# Patient Record
Sex: Female | Born: 1960
Health system: Southern US, Community
[De-identification: ages and names within clinical notes are randomized; demographics above are authoritative.]

## PROBLEM LIST (undated history)

## (undated) DIAGNOSIS — B379 Candidiasis, unspecified: Secondary | ICD-10-CM

## (undated) DIAGNOSIS — M8580 Other specified disorders of bone density and structure, unspecified site: Secondary | ICD-10-CM

## (undated) DIAGNOSIS — Z789 Other specified health status: Secondary | ICD-10-CM

## (undated) DIAGNOSIS — C801 Malignant (primary) neoplasm, unspecified: Secondary | ICD-10-CM

## (undated) DIAGNOSIS — D239 Other benign neoplasm of skin, unspecified: Secondary | ICD-10-CM

## (undated) HISTORY — DX: Other benign neoplasm of skin, unspecified: D23.9

## (undated) HISTORY — PX: WISDOM TOOTH EXTRACTION: SHX21

## (undated) HISTORY — DX: Other specified disorders of bone density and structure, unspecified site: M85.80

## (undated) HISTORY — DX: Malignant (primary) neoplasm, unspecified: C80.1

## (undated) HISTORY — DX: Candidiasis, unspecified: B37.9

## (undated) HISTORY — PX: GYNECOLOGIC CRYOSURGERY: SHX857

## (undated) HISTORY — PX: MOLE REMOVAL: SHX2046

## (undated) HISTORY — PX: BREAST LUMPECTOMY: SHX2

---

## 1997-09-18 ENCOUNTER — Other Ambulatory Visit: Admission: RE | Admit: 1997-09-18 | Discharge: 1997-09-18 | Payer: Self-pay | Admitting: *Deleted

## 1999-09-17 ENCOUNTER — Other Ambulatory Visit: Admission: RE | Admit: 1999-09-17 | Discharge: 1999-09-17 | Payer: Self-pay | Admitting: *Deleted

## 2001-08-30 ENCOUNTER — Other Ambulatory Visit: Admission: RE | Admit: 2001-08-30 | Discharge: 2001-08-30 | Payer: Self-pay | Admitting: *Deleted

## 2002-09-15 ENCOUNTER — Other Ambulatory Visit: Admission: RE | Admit: 2002-09-15 | Discharge: 2002-09-15 | Payer: Self-pay | Admitting: *Deleted

## 2003-04-18 ENCOUNTER — Other Ambulatory Visit: Admission: RE | Admit: 2003-04-18 | Discharge: 2003-04-18 | Payer: Self-pay | Admitting: Radiology

## 2003-05-02 ENCOUNTER — Encounter (HOSPITAL_COMMUNITY): Admission: RE | Admit: 2003-05-02 | Discharge: 2003-07-31 | Payer: Self-pay | Admitting: Surgery

## 2003-05-14 ENCOUNTER — Encounter: Admission: RE | Admit: 2003-05-14 | Discharge: 2003-05-14 | Payer: Self-pay | Admitting: Surgery

## 2003-05-17 ENCOUNTER — Encounter (INDEPENDENT_AMBULATORY_CARE_PROVIDER_SITE_OTHER): Payer: Self-pay | Admitting: Specialist

## 2003-05-17 ENCOUNTER — Ambulatory Visit (HOSPITAL_COMMUNITY): Admission: RE | Admit: 2003-05-17 | Discharge: 2003-05-17 | Payer: Self-pay | Admitting: Surgery

## 2003-05-17 ENCOUNTER — Encounter (INDEPENDENT_AMBULATORY_CARE_PROVIDER_SITE_OTHER): Payer: Self-pay | Admitting: Surgery

## 2003-05-17 ENCOUNTER — Ambulatory Visit (HOSPITAL_BASED_OUTPATIENT_CLINIC_OR_DEPARTMENT_OTHER): Admission: RE | Admit: 2003-05-17 | Discharge: 2003-05-17 | Payer: Self-pay | Admitting: Surgery

## 2003-06-19 ENCOUNTER — Ambulatory Visit: Admission: RE | Admit: 2003-06-19 | Discharge: 2003-08-15 | Payer: Self-pay | Admitting: Radiation Oncology

## 2003-06-20 ENCOUNTER — Ambulatory Visit (HOSPITAL_COMMUNITY): Admission: RE | Admit: 2003-06-20 | Discharge: 2003-06-20 | Payer: Self-pay | Admitting: Oncology

## 2003-06-22 ENCOUNTER — Ambulatory Visit (HOSPITAL_COMMUNITY): Admission: RE | Admit: 2003-06-22 | Discharge: 2003-06-22 | Payer: Self-pay | Admitting: Oncology

## 2003-06-29 ENCOUNTER — Ambulatory Visit (HOSPITAL_COMMUNITY): Admission: RE | Admit: 2003-06-29 | Discharge: 2003-06-29 | Payer: Self-pay | Admitting: Surgery

## 2003-06-29 ENCOUNTER — Ambulatory Visit (HOSPITAL_BASED_OUTPATIENT_CLINIC_OR_DEPARTMENT_OTHER): Admission: RE | Admit: 2003-06-29 | Discharge: 2003-06-29 | Payer: Self-pay | Admitting: Surgery

## 2003-07-04 ENCOUNTER — Ambulatory Visit (HOSPITAL_COMMUNITY): Admission: RE | Admit: 2003-07-04 | Discharge: 2003-07-04 | Payer: Self-pay | Admitting: Oncology

## 2003-07-19 ENCOUNTER — Inpatient Hospital Stay (HOSPITAL_COMMUNITY): Admission: EM | Admit: 2003-07-19 | Discharge: 2003-07-20 | Payer: Self-pay | Admitting: Emergency Medicine

## 2003-10-30 ENCOUNTER — Ambulatory Visit: Admission: RE | Admit: 2003-10-30 | Discharge: 2003-10-30 | Payer: Self-pay | Admitting: Radiation Oncology

## 2003-10-31 ENCOUNTER — Ambulatory Visit: Admission: RE | Admit: 2003-10-31 | Discharge: 2003-12-26 | Payer: Self-pay | Admitting: Radiation Oncology

## 2004-01-24 ENCOUNTER — Ambulatory Visit: Admission: RE | Admit: 2004-01-24 | Discharge: 2004-01-24 | Payer: Self-pay | Admitting: Radiation Oncology

## 2004-01-26 DIAGNOSIS — C50919 Malignant neoplasm of unspecified site of unspecified female breast: Secondary | ICD-10-CM | POA: Insufficient documentation

## 2004-02-25 ENCOUNTER — Ambulatory Visit (HOSPITAL_BASED_OUTPATIENT_CLINIC_OR_DEPARTMENT_OTHER): Admission: RE | Admit: 2004-02-25 | Discharge: 2004-02-25 | Payer: Self-pay | Admitting: Surgery

## 2004-04-17 ENCOUNTER — Ambulatory Visit: Payer: Self-pay | Admitting: Oncology

## 2004-07-09 ENCOUNTER — Ambulatory Visit: Payer: Self-pay | Admitting: Oncology

## 2004-07-10 ENCOUNTER — Ambulatory Visit: Payer: Self-pay | Admitting: Oncology

## 2004-09-18 ENCOUNTER — Other Ambulatory Visit: Admission: RE | Admit: 2004-09-18 | Discharge: 2004-09-18 | Payer: Self-pay | Admitting: Obstetrics and Gynecology

## 2004-10-08 ENCOUNTER — Ambulatory Visit: Payer: Self-pay | Admitting: Oncology

## 2004-10-23 ENCOUNTER — Ambulatory Visit (HOSPITAL_COMMUNITY): Admission: RE | Admit: 2004-10-23 | Discharge: 2004-10-23 | Payer: Self-pay | Admitting: Oncology

## 2004-10-31 ENCOUNTER — Ambulatory Visit (HOSPITAL_COMMUNITY): Admission: RE | Admit: 2004-10-31 | Discharge: 2004-10-31 | Payer: Self-pay | Admitting: Oncology

## 2004-11-19 ENCOUNTER — Encounter (INDEPENDENT_AMBULATORY_CARE_PROVIDER_SITE_OTHER): Payer: Self-pay | Admitting: *Deleted

## 2004-11-19 ENCOUNTER — Other Ambulatory Visit: Admission: RE | Admit: 2004-11-19 | Discharge: 2004-11-19 | Payer: Self-pay | Admitting: Interventional Radiology

## 2004-11-19 ENCOUNTER — Encounter: Admission: RE | Admit: 2004-11-19 | Discharge: 2004-11-19 | Payer: Self-pay | Admitting: Oncology

## 2005-01-07 ENCOUNTER — Ambulatory Visit: Payer: Self-pay | Admitting: Oncology

## 2005-05-13 ENCOUNTER — Ambulatory Visit: Payer: Self-pay | Admitting: Oncology

## 2005-09-02 ENCOUNTER — Encounter: Admission: RE | Admit: 2005-09-02 | Discharge: 2005-09-02 | Payer: Self-pay | Admitting: Surgery

## 2005-09-08 ENCOUNTER — Ambulatory Visit: Payer: Self-pay | Admitting: Oncology

## 2005-09-10 LAB — COMPREHENSIVE METABOLIC PANEL
ALT: 11 U/L (ref 0–40)
AST: 19 U/L (ref 0–37)
Albumin: 4.5 g/dL (ref 3.5–5.2)
Alkaline Phosphatase: 54 U/L (ref 39–117)
BUN: 12 mg/dL (ref 6–23)
CO2: 28 mEq/L (ref 19–32)
Calcium: 9.2 mg/dL (ref 8.4–10.5)
Chloride: 101 mEq/L (ref 96–112)
Creatinine, Ser: 0.9 mg/dL (ref 0.4–1.2)
Glucose, Bld: 95 mg/dL (ref 70–99)
Potassium: 4.2 mEq/L (ref 3.5–5.3)
Sodium: 141 mEq/L (ref 135–145)
Total Bilirubin: 0.3 mg/dL (ref 0.3–1.2)
Total Protein: 7.3 g/dL (ref 6.0–8.3)

## 2005-09-10 LAB — CBC WITH DIFFERENTIAL/PLATELET
BASO%: 0.6 % (ref 0.0–2.0)
Basophils Absolute: 0 10*3/uL (ref 0.0–0.1)
EOS%: 1.9 % (ref 0.0–7.0)
Eosinophils Absolute: 0.1 10*3/uL (ref 0.0–0.5)
HCT: 35.8 % (ref 34.8–46.6)
HGB: 12.1 g/dL (ref 11.6–15.9)
LYMPH%: 34.2 % (ref 14.0–48.0)
MCH: 29.8 pg (ref 26.0–34.0)
MCHC: 33.8 g/dL (ref 32.0–36.0)
MCV: 88.2 fL (ref 81.0–101.0)
MONO#: 0.4 10*3/uL (ref 0.1–0.9)
MONO%: 8.3 % (ref 0.0–13.0)
NEUT#: 2.8 10*3/uL (ref 1.5–6.5)
NEUT%: 55 % (ref 39.6–76.8)
Platelets: 286 10*3/uL (ref 145–400)
RBC: 4.07 10*6/uL (ref 3.70–5.32)
RDW: 13.5 % (ref 11.3–14.5)
WBC: 5 10*3/uL (ref 3.9–10.0)
lymph#: 1.7 10*3/uL (ref 0.9–3.3)

## 2005-09-10 LAB — LACTATE DEHYDROGENASE: LDH: 155 U/L (ref 94–250)

## 2005-09-10 LAB — CANCER ANTIGEN 27.29: CA 27.29: 20 U/mL (ref 0–39)

## 2005-09-16 ENCOUNTER — Ambulatory Visit (HOSPITAL_COMMUNITY): Admission: RE | Admit: 2005-09-16 | Discharge: 2005-09-16 | Payer: Self-pay | Admitting: Oncology

## 2005-11-18 ENCOUNTER — Other Ambulatory Visit: Admission: RE | Admit: 2005-11-18 | Discharge: 2005-11-18 | Payer: Self-pay | Admitting: Obstetrics and Gynecology

## 2005-12-02 ENCOUNTER — Encounter: Admission: RE | Admit: 2005-12-02 | Discharge: 2005-12-02 | Payer: Self-pay | Admitting: Oncology

## 2005-12-02 ENCOUNTER — Encounter: Admission: RE | Admit: 2005-12-02 | Discharge: 2005-12-23 | Payer: Self-pay | Admitting: Family Medicine

## 2006-01-01 ENCOUNTER — Ambulatory Visit: Payer: Self-pay | Admitting: Oncology

## 2006-01-04 LAB — CBC WITH DIFFERENTIAL/PLATELET
BASO%: 1 % (ref 0.0–2.0)
Basophils Absolute: 0 10*3/uL (ref 0.0–0.1)
EOS%: 1.5 % (ref 0.0–7.0)
Eosinophils Absolute: 0.1 10*3/uL (ref 0.0–0.5)
HCT: 36.1 % (ref 34.8–46.6)
HGB: 12.3 g/dL (ref 11.6–15.9)
LYMPH%: 36.5 % (ref 14.0–48.0)
MCH: 29.8 pg (ref 26.0–34.0)
MCHC: 34 g/dL (ref 32.0–36.0)
MCV: 87.7 fL (ref 81.0–101.0)
MONO#: 0.4 10*3/uL (ref 0.1–0.9)
MONO%: 7.6 % (ref 0.0–13.0)
NEUT#: 2.5 10*3/uL (ref 1.5–6.5)
NEUT%: 53.4 % (ref 39.6–76.8)
Platelets: 290 10*3/uL (ref 145–400)
RBC: 4.12 10*6/uL (ref 3.70–5.32)
RDW: 13.8 % (ref 11.3–14.5)
WBC: 4.7 10*3/uL (ref 3.9–10.0)
lymph#: 1.7 10*3/uL (ref 0.9–3.3)

## 2006-01-11 LAB — COMPREHENSIVE METABOLIC PANEL
ALT: 13 U/L (ref 0–40)
AST: 19 U/L (ref 0–37)
Albumin: 4.7 g/dL (ref 3.5–5.2)
Alkaline Phosphatase: 45 U/L (ref 39–117)
BUN: 12 mg/dL (ref 6–23)
CO2: 29 mEq/L (ref 19–32)
Calcium: 9.8 mg/dL (ref 8.4–10.5)
Chloride: 105 mEq/L (ref 96–112)
Creatinine, Ser: 0.88 mg/dL (ref 0.40–1.20)
Glucose, Bld: 86 mg/dL (ref 70–99)
Potassium: 4.3 mEq/L (ref 3.5–5.3)
Sodium: 143 mEq/L (ref 135–145)
Total Bilirubin: 0.3 mg/dL (ref 0.3–1.2)
Total Protein: 7.4 g/dL (ref 6.0–8.3)

## 2006-01-11 LAB — CANCER ANTIGEN 27.29: CA 27.29: 20 U/mL (ref 0–39)

## 2006-01-11 LAB — FOLLICLE STIMULATING HORMONE: FSH: 50.2 m[IU]/mL

## 2006-01-11 LAB — LACTATE DEHYDROGENASE: LDH: 155 U/L (ref 94–250)

## 2006-01-11 LAB — ESTRADIOL, ULTRA SENS: Estradiol, Ultra Sensitive: 19 pg/mL

## 2006-02-04 IMAGING — US US BIOPSY
1 series · 12 of 12 positions shown · non-contrast
Comparison: Thyroid Ultrasound, 10/31/04 ? [HOSPITAL]

CLINICAL DATA: 43 year old female with history of breast carcinoma and multinodular goiter.   Request to perform fine needle aspiration of  the 1.8 cm mass in the anterior aspect of the mid portion of the right lobe of the thyroid.  
 ULTRASOUND GUIDED THYROID BIOPSY RIGHT LOBE OF THYROID:

[Series 1: unknown · 0.06mm/px · 12 of 12 slices shown]
[im 1/12]
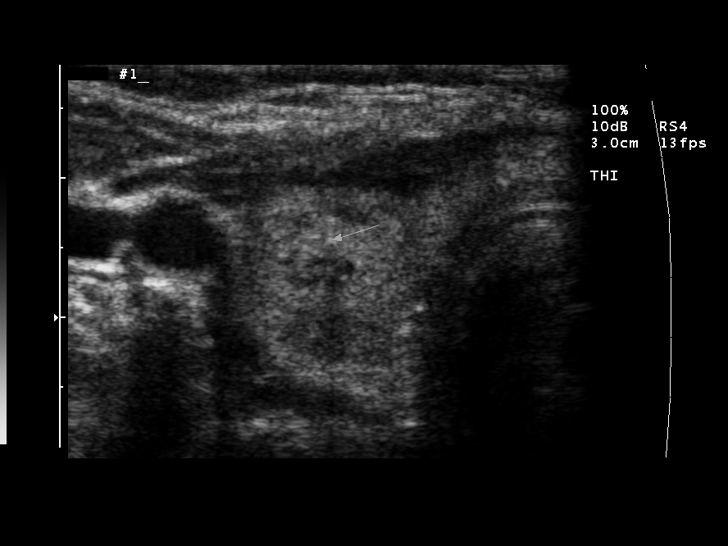
[im 2/12]
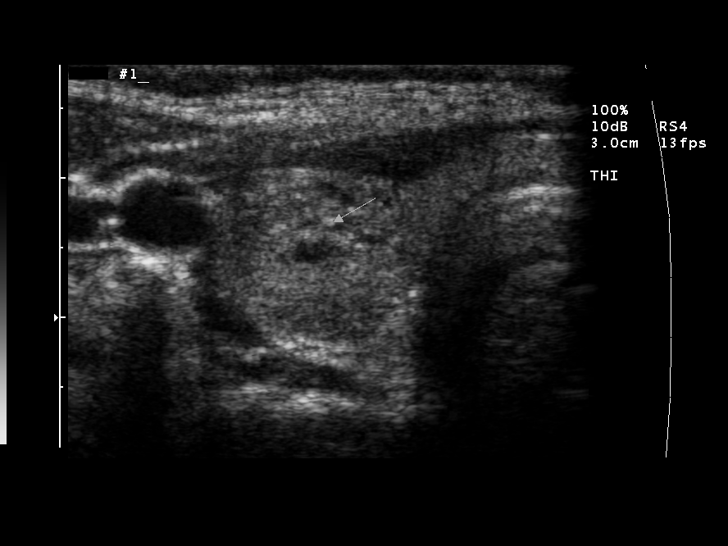
[im 3/12]
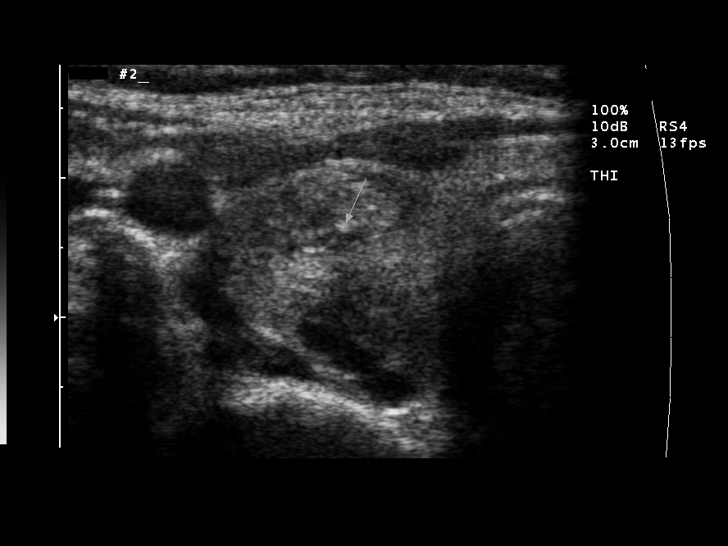
[im 4/12]
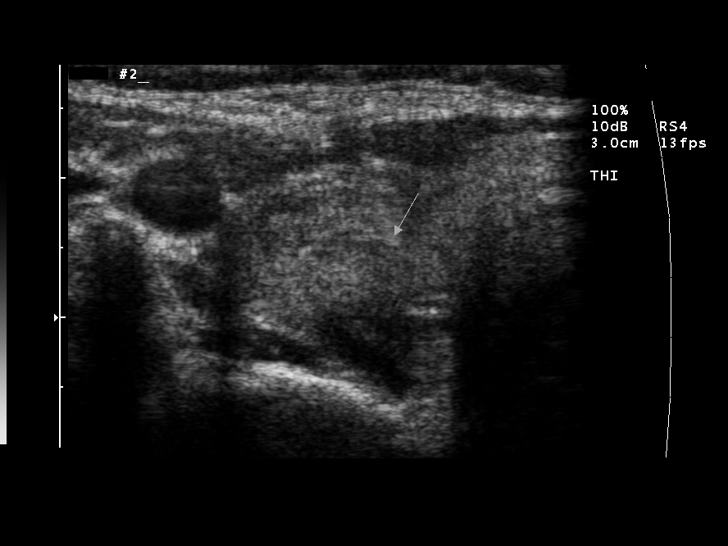
[im 5/12]
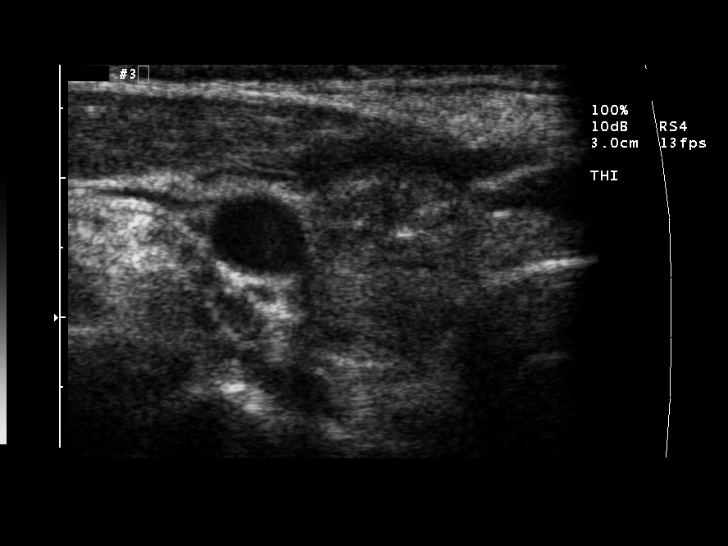
[im 6/12]
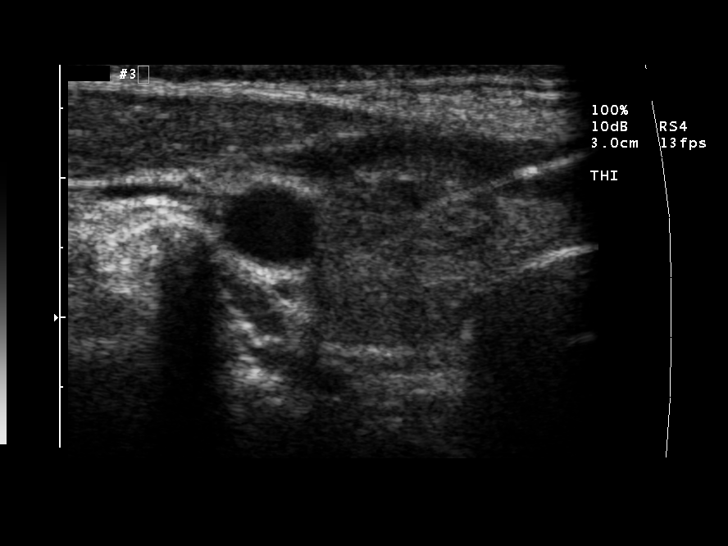
[im 7/12]
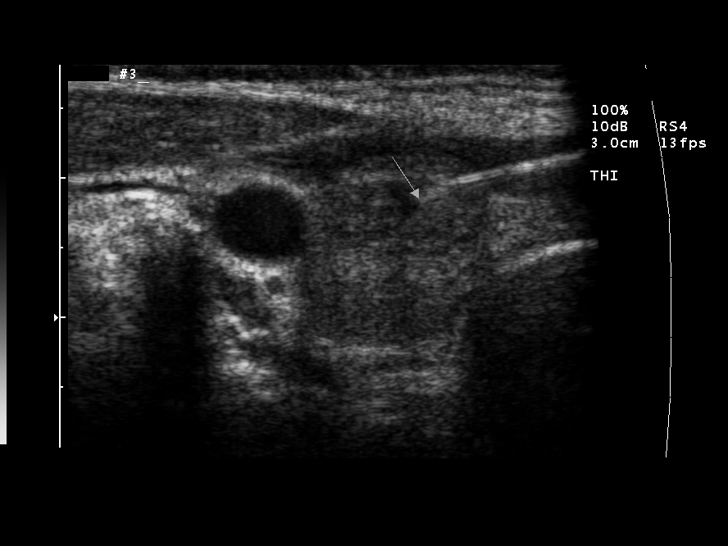
[im 8/12]
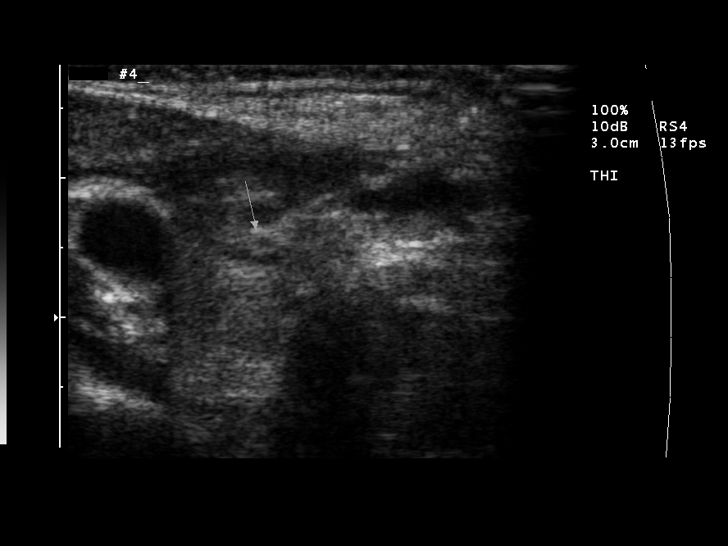
[im 9/12]
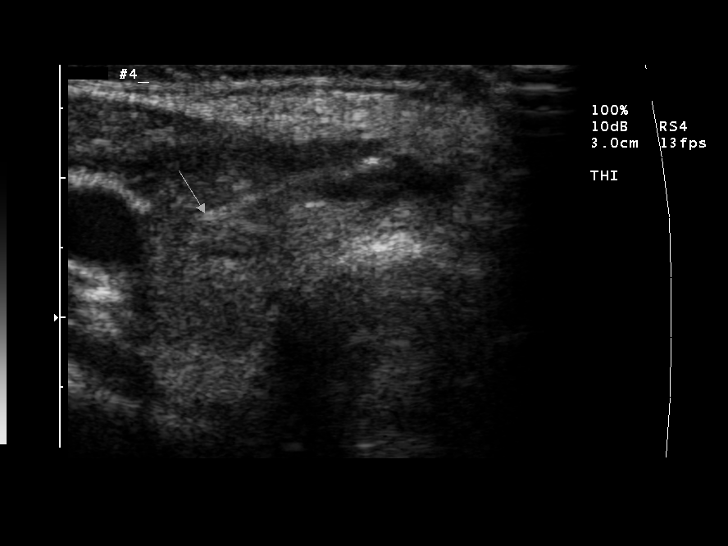
[im 10/12]
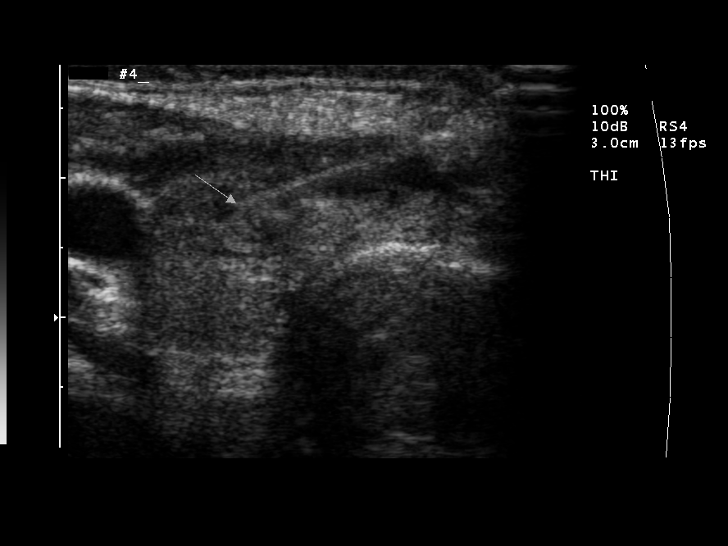
[im 11/12]
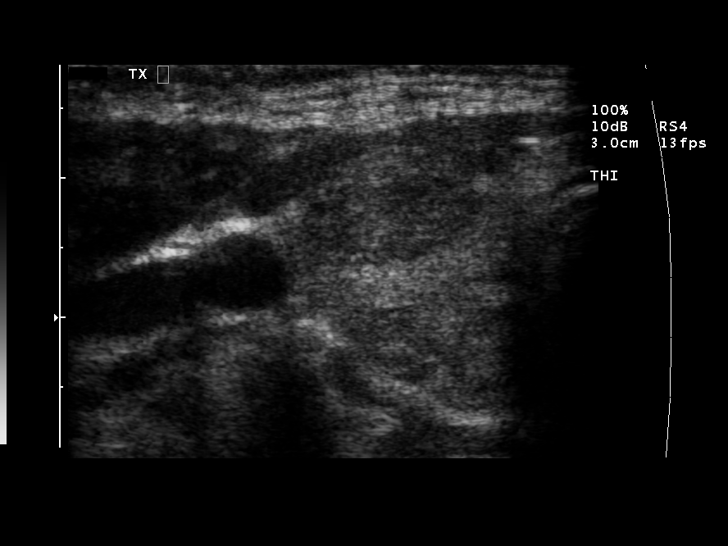
[im 12/12]
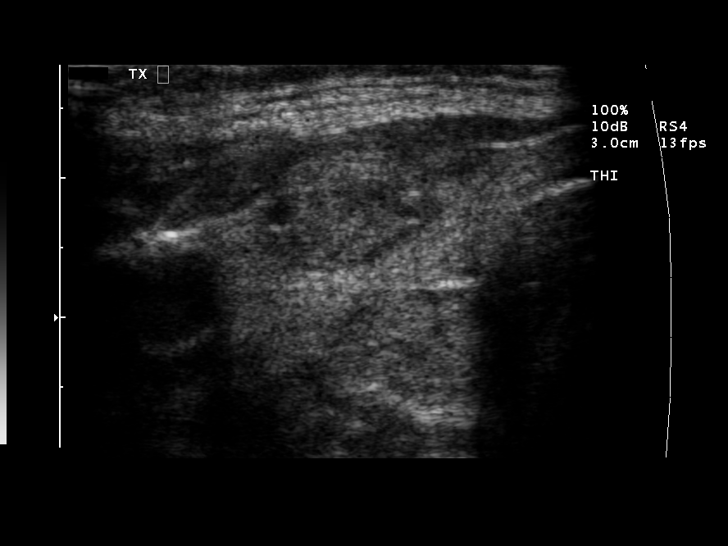

[12 of 12 positions shown; findings below may reference images not displayed]

FINDINGS: The above procedure was thoroughly discussed with the patient and written informed consent was obtained.
 Ultrasound was then performed to localize and mark an adequate site for the biopsy.  The patient was then prepped and draped in a normal sterile fashion, 1% Lidocaine was used for local anesthesia.  Using direct ultrasound guidance, four passes were made using a 25 gauge hypodermic needle into the 1.8 cm mass in the   in the anterior aspect of right lobe of the thyroid.  Ultrasound confirmed placement of the needle on all four occasions.  The specimens were given to pathology for further analysis.   Scant follicularity was obtained from all aspirates.   Post procedure imaging demonstrated no hematoma or immediate complication. The patient tolerated the procedure well.
IMPRESSION: Successful ultrasound guided fine needle aspiration of a 1.8 cm mass in the right lobe of the thyroid.  Final pathology pending.

## 2006-07-14 ENCOUNTER — Ambulatory Visit: Payer: Self-pay | Admitting: Oncology

## 2006-07-19 LAB — CBC WITH DIFFERENTIAL/PLATELET
BASO%: 0.3 % (ref 0.0–2.0)
Basophils Absolute: 0 10*3/uL (ref 0.0–0.1)
EOS%: 0.8 % (ref 0.0–7.0)
Eosinophils Absolute: 0.1 10*3/uL (ref 0.0–0.5)
HCT: 38.6 % (ref 34.8–46.6)
HGB: 13.4 g/dL (ref 11.6–15.9)
LYMPH%: 22.1 % (ref 14.0–48.0)
MCH: 29.9 pg (ref 26.0–34.0)
MCHC: 34.8 g/dL (ref 32.0–36.0)
MCV: 86.1 fL (ref 81.0–101.0)
MONO#: 0.3 10*3/uL (ref 0.1–0.9)
MONO%: 5.4 % (ref 0.0–13.0)
NEUT#: 4.3 10*3/uL (ref 1.5–6.5)
NEUT%: 71.4 % (ref 39.6–76.8)
Platelets: 307 10*3/uL (ref 145–400)
RBC: 4.48 10*6/uL (ref 3.70–5.32)
RDW: 13.5 % (ref 11.3–14.5)
WBC: 6 10*3/uL (ref 3.9–10.0)
lymph#: 1.3 10*3/uL (ref 0.9–3.3)

## 2006-07-19 LAB — COMPREHENSIVE METABOLIC PANEL
ALT: 14 U/L (ref 0–35)
AST: 19 U/L (ref 0–37)
Albumin: 5 g/dL (ref 3.5–5.2)
Alkaline Phosphatase: 77 U/L (ref 39–117)
BUN: 12 mg/dL (ref 6–23)
CO2: 29 mEq/L (ref 19–32)
Calcium: 10.4 mg/dL (ref 8.4–10.5)
Chloride: 101 mEq/L (ref 96–112)
Creatinine, Ser: 0.86 mg/dL (ref 0.40–1.20)
Glucose, Bld: 102 mg/dL — ABNORMAL HIGH (ref 70–99)
Potassium: 3.9 mEq/L (ref 3.5–5.3)
Sodium: 141 mEq/L (ref 135–145)
Total Bilirubin: 0.4 mg/dL (ref 0.3–1.2)
Total Protein: 7.5 g/dL (ref 6.0–8.3)

## 2006-07-19 LAB — CANCER ANTIGEN 27.29: CA 27.29: 17 U/mL (ref 0–39)

## 2006-07-19 LAB — LACTATE DEHYDROGENASE: LDH: 158 U/L (ref 94–250)

## 2007-01-12 ENCOUNTER — Ambulatory Visit: Payer: Self-pay | Admitting: Oncology

## 2007-01-14 LAB — LACTATE DEHYDROGENASE: LDH: 160 U/L (ref 94–250)

## 2007-01-14 LAB — COMPREHENSIVE METABOLIC PANEL
ALT: 10 U/L (ref 0–35)
AST: 17 U/L (ref 0–37)
Albumin: 4.6 g/dL (ref 3.5–5.2)
Alkaline Phosphatase: 67 U/L (ref 39–117)
BUN: 11 mg/dL (ref 6–23)
CO2: 26 mEq/L (ref 19–32)
Calcium: 9.7 mg/dL (ref 8.4–10.5)
Chloride: 103 mEq/L (ref 96–112)
Creatinine, Ser: 0.84 mg/dL (ref 0.40–1.20)
Glucose, Bld: 107 mg/dL — ABNORMAL HIGH (ref 70–99)
Potassium: 4 mEq/L (ref 3.5–5.3)
Sodium: 141 mEq/L (ref 135–145)
Total Bilirubin: 0.3 mg/dL (ref 0.3–1.2)
Total Protein: 7.4 g/dL (ref 6.0–8.3)

## 2007-01-14 LAB — CBC WITH DIFFERENTIAL/PLATELET
BASO%: 0.7 % (ref 0.0–2.0)
Basophils Absolute: 0 10*3/uL (ref 0.0–0.1)
EOS%: 2.1 % (ref 0.0–7.0)
Eosinophils Absolute: 0.1 10*3/uL (ref 0.0–0.5)
HCT: 36.5 % (ref 34.8–46.6)
HGB: 12.8 g/dL (ref 11.6–15.9)
LYMPH%: 35.2 % (ref 14.0–48.0)
MCH: 30.1 pg (ref 26.0–34.0)
MCHC: 34.9 g/dL (ref 32.0–36.0)
MCV: 86.3 fL (ref 81.0–101.0)
MONO#: 0.3 10*3/uL (ref 0.1–0.9)
MONO%: 7.3 % (ref 0.0–13.0)
NEUT#: 2.6 10*3/uL (ref 1.5–6.5)
NEUT%: 54.7 % (ref 39.6–76.8)
Platelets: 291 10*3/uL (ref 145–400)
RBC: 4.24 10*6/uL (ref 3.70–5.32)
RDW: 13.7 % (ref 11.3–14.5)
WBC: 4.7 10*3/uL (ref 3.9–10.0)
lymph#: 1.7 10*3/uL (ref 0.9–3.3)

## 2007-01-14 LAB — CANCER ANTIGEN 27.29: CA 27.29: 14 U/mL (ref 0–39)

## 2007-01-14 LAB — FOLLICLE STIMULATING HORMONE: FSH: 122.2 m[IU]/mL

## 2007-01-24 LAB — ESTRADIOL, ULTRA SENS: Estradiol, Ultra Sensitive: <2 pg/mL

## 2007-07-12 ENCOUNTER — Ambulatory Visit: Payer: Self-pay | Admitting: Oncology

## 2007-07-14 LAB — CBC WITH DIFFERENTIAL/PLATELET
BASO%: 0.5 % (ref 0.0–2.0)
Basophils Absolute: 0 10*3/uL (ref 0.0–0.1)
EOS%: 3.7 % (ref 0.0–7.0)
Eosinophils Absolute: 0.2 10*3/uL (ref 0.0–0.5)
HCT: 38.7 % (ref 34.8–46.6)
HGB: 13.4 g/dL (ref 11.6–15.9)
LYMPH%: 27.2 % (ref 14.0–48.0)
MCH: 29.8 pg (ref 26.0–34.0)
MCHC: 34.5 g/dL (ref 32.0–36.0)
MCV: 86.4 fL (ref 81.0–101.0)
MONO#: 0.4 10*3/uL (ref 0.1–0.9)
MONO%: 8.3 % (ref 0.0–13.0)
NEUT#: 3.1 10*3/uL (ref 1.5–6.5)
NEUT%: 60.3 % (ref 39.6–76.8)
Platelets: 319 10*3/uL (ref 145–400)
RBC: 4.48 10*6/uL (ref 3.70–5.32)
RDW: 13.8 % (ref 11.3–14.5)
WBC: 5.1 10*3/uL (ref 3.9–10.0)
lymph#: 1.4 10*3/uL (ref 0.9–3.3)

## 2007-07-15 ENCOUNTER — Encounter: Admission: RE | Admit: 2007-07-15 | Discharge: 2007-07-15 | Payer: Self-pay | Admitting: Oncology

## 2007-07-15 LAB — COMPREHENSIVE METABOLIC PANEL
ALT: 13 U/L (ref 0–35)
AST: 18 U/L (ref 0–37)
Albumin: 4.9 g/dL (ref 3.5–5.2)
Alkaline Phosphatase: 80 U/L (ref 39–117)
BUN: 13 mg/dL (ref 6–23)
CO2: 27 mEq/L (ref 19–32)
Calcium: 10.4 mg/dL (ref 8.4–10.5)
Chloride: 102 mEq/L (ref 96–112)
Creatinine, Ser: 0.85 mg/dL (ref 0.40–1.20)
Glucose, Bld: 97 mg/dL (ref 70–99)
Potassium: 3.9 mEq/L (ref 3.5–5.3)
Sodium: 139 mEq/L (ref 135–145)
Total Bilirubin: 0.5 mg/dL (ref 0.3–1.2)
Total Protein: 7.7 g/dL (ref 6.0–8.3)

## 2007-07-15 LAB — LACTATE DEHYDROGENASE: LDH: 157 U/L (ref 94–250)

## 2007-07-15 LAB — CANCER ANTIGEN 27.29: CA 27.29: 19 U/mL (ref 0–39)

## 2007-07-15 LAB — VITAMIN D 25 HYDROXY (VIT D DEFICIENCY, FRACTURES): Vit D, 25-Hydroxy: 46 ng/mL (ref 30–89)

## 2007-07-19 LAB — VITAMIN D 1,25 DIHYDROXY: Vit D, 1,25-Dihydroxy: 83 pg/mL — ABNORMAL HIGH (ref 6–62)

## 2007-12-05 ENCOUNTER — Encounter: Admission: RE | Admit: 2007-12-05 | Discharge: 2007-12-05 | Payer: Self-pay | Admitting: Oncology

## 2008-02-08 ENCOUNTER — Ambulatory Visit: Payer: Self-pay | Admitting: Oncology

## 2008-02-10 LAB — CBC WITH DIFFERENTIAL/PLATELET
BASO%: 0.7 % (ref 0.0–2.0)
Basophils Absolute: 0 10e3/uL (ref 0.0–0.1)
EOS%: 1.8 % (ref 0.0–7.0)
Eosinophils Absolute: 0.1 10e3/uL (ref 0.0–0.5)
HCT: 36.4 % (ref 34.8–46.6)
HGB: 12.6 g/dL (ref 11.6–15.9)
LYMPH%: 33.1 % (ref 14.0–48.0)
MCH: 30.4 pg (ref 26.0–34.0)
MCHC: 34.8 g/dL (ref 32.0–36.0)
MCV: 87.3 fL (ref 81.0–101.0)
MONO#: 0.4 10e3/uL (ref 0.1–0.9)
MONO%: 7.2 % (ref 0.0–13.0)
NEUT#: 3 10e3/uL (ref 1.5–6.5)
NEUT%: 57.2 % (ref 39.6–76.8)
Platelets: 278 10e3/uL (ref 145–400)
RBC: 4.17 10e6/uL (ref 3.70–5.32)
RDW: 14 % (ref 11.3–14.5)
WBC: 5.2 10e3/uL (ref 3.9–10.0)
lymph#: 1.7 10e3/uL (ref 0.9–3.3)

## 2008-02-13 LAB — COMPREHENSIVE METABOLIC PANEL
ALT: 14 U/L (ref 0–35)
AST: 17 U/L (ref 0–37)
Albumin: 4.6 g/dL (ref 3.5–5.2)
Alkaline Phosphatase: 79 U/L (ref 39–117)
BUN: 15 mg/dL (ref 6–23)
CO2: 26 mEq/L (ref 19–32)
Calcium: 9.6 mg/dL (ref 8.4–10.5)
Chloride: 104 mEq/L (ref 96–112)
Creatinine, Ser: 0.86 mg/dL (ref 0.40–1.20)
Glucose, Bld: 93 mg/dL (ref 70–99)
Potassium: 3.7 mEq/L (ref 3.5–5.3)
Sodium: 140 mEq/L (ref 135–145)
Total Bilirubin: 0.3 mg/dL (ref 0.3–1.2)
Total Protein: 7.2 g/dL (ref 6.0–8.3)

## 2008-02-13 LAB — CANCER ANTIGEN 27.29: CA 27.29: 15 U/mL (ref 0–39)

## 2008-02-13 LAB — FOLLICLE STIMULATING HORMONE: FSH: 90.3 m[IU]/mL

## 2008-02-13 LAB — LACTATE DEHYDROGENASE: LDH: 152 U/L (ref 94–250)

## 2008-02-13 LAB — VITAMIN D 25 HYDROXY (VIT D DEFICIENCY, FRACTURES): Vit D, 25-Hydroxy: 50 ng/mL (ref 30–89)

## 2008-02-19 LAB — ESTRADIOL, ULTRA SENS

## 2008-08-06 ENCOUNTER — Ambulatory Visit: Payer: Self-pay | Admitting: Oncology

## 2008-08-08 LAB — COMPREHENSIVE METABOLIC PANEL
ALT: 14 U/L (ref 0–35)
AST: 18 U/L (ref 0–37)
Albumin: 4.1 g/dL (ref 3.5–5.2)
Alkaline Phosphatase: 59 U/L (ref 39–117)
BUN: 13 mg/dL (ref 6–23)
CO2: 28 mEq/L (ref 19–32)
Calcium: 9.4 mg/dL (ref 8.4–10.5)
Chloride: 105 mEq/L (ref 96–112)
Creatinine, Ser: 0.79 mg/dL (ref 0.40–1.20)
Glucose, Bld: 109 mg/dL — ABNORMAL HIGH (ref 70–99)
Potassium: 3.8 mEq/L (ref 3.5–5.3)
Sodium: 139 mEq/L (ref 135–145)
Total Bilirubin: 0.7 mg/dL (ref 0.3–1.2)
Total Protein: 7.3 g/dL (ref 6.0–8.3)

## 2008-08-08 LAB — CBC WITH DIFFERENTIAL/PLATELET
BASO%: 0.5 % (ref 0.0–2.0)
Basophils Absolute: 0 10*3/uL (ref 0.0–0.1)
EOS%: 1.9 % (ref 0.0–7.0)
Eosinophils Absolute: 0.1 10*3/uL (ref 0.0–0.5)
HCT: 37.8 % (ref 34.8–46.6)
HGB: 12.9 g/dL (ref 11.6–15.9)
LYMPH%: 22.7 % (ref 14.0–49.7)
MCH: 30.2 pg (ref 25.1–34.0)
MCHC: 34.2 g/dL (ref 31.5–36.0)
MCV: 88.4 fL (ref 79.5–101.0)
MONO#: 0.5 10*3/uL (ref 0.1–0.9)
MONO%: 7.3 % (ref 0.0–14.0)
NEUT#: 4.4 10*3/uL (ref 1.5–6.5)
NEUT%: 67.6 % (ref 38.4–76.8)
Platelets: 283 10*3/uL (ref 145–400)
RBC: 4.27 10*6/uL (ref 3.70–5.45)
RDW: 13.6 % (ref 11.2–14.5)
WBC: 6.5 10*3/uL (ref 3.9–10.3)
lymph#: 1.5 10*3/uL (ref 0.9–3.3)

## 2008-08-08 LAB — LACTATE DEHYDROGENASE: LDH: 135 U/L (ref 94–250)

## 2008-08-09 LAB — VITAMIN D 25 HYDROXY (VIT D DEFICIENCY, FRACTURES): Vit D, 25-Hydroxy: 44 ng/mL (ref 30–89)

## 2008-08-09 LAB — CANCER ANTIGEN 27.29: CA 27.29: 23 U/mL (ref 0–39)

## 2009-02-15 ENCOUNTER — Ambulatory Visit: Payer: Self-pay | Admitting: Oncology

## 2009-02-19 LAB — CBC WITH DIFFERENTIAL/PLATELET
BASO%: 0.6 % (ref 0.0–2.0)
Basophils Absolute: 0 10*3/uL (ref 0.0–0.1)
EOS%: 1.9 % (ref 0.0–7.0)
Eosinophils Absolute: 0.1 10*3/uL (ref 0.0–0.5)
HCT: 37.6 % (ref 34.8–46.6)
HGB: 12.7 g/dL (ref 11.6–15.9)
LYMPH%: 33.1 % (ref 14.0–49.7)
MCH: 30.3 pg (ref 25.1–34.0)
MCHC: 33.9 g/dL (ref 31.5–36.0)
MCV: 89.4 fL (ref 79.5–101.0)
MONO#: 0.5 10*3/uL (ref 0.1–0.9)
MONO%: 10 % (ref 0.0–14.0)
NEUT#: 3 10*3/uL (ref 1.5–6.5)
NEUT%: 54.4 % (ref 38.4–76.8)
Platelets: 289 10*3/uL (ref 145–400)
RBC: 4.2 10*6/uL (ref 3.70–5.45)
RDW: 14.1 % (ref 11.2–14.5)
WBC: 5.4 10*3/uL (ref 3.9–10.3)
lymph#: 1.8 10*3/uL (ref 0.9–3.3)

## 2009-02-19 LAB — COMPREHENSIVE METABOLIC PANEL
ALT: 15 U/L (ref 0–35)
AST: 22 U/L (ref 0–37)
Albumin: 4.2 g/dL (ref 3.5–5.2)
Alkaline Phosphatase: 47 U/L (ref 39–117)
BUN: 11 mg/dL (ref 6–23)
CO2: 29 mEq/L (ref 19–32)
Calcium: 9.2 mg/dL (ref 8.4–10.5)
Chloride: 98 mEq/L (ref 96–112)
Creatinine, Ser: 0.98 mg/dL (ref 0.40–1.20)
Glucose, Bld: 102 mg/dL — ABNORMAL HIGH (ref 70–99)
Potassium: 3.6 mEq/L (ref 3.5–5.3)
Sodium: 135 mEq/L (ref 135–145)
Total Bilirubin: 0.6 mg/dL (ref 0.3–1.2)
Total Protein: 7 g/dL (ref 6.0–8.3)

## 2009-02-19 LAB — LACTATE DEHYDROGENASE: LDH: 134 U/L (ref 94–250)

## 2009-02-20 LAB — CANCER ANTIGEN 27.29: CA 27.29: 18 U/mL (ref 0–39)

## 2009-02-20 LAB — VITAMIN D 25 HYDROXY (VIT D DEFICIENCY, FRACTURES): Vit D, 25-Hydroxy: 39 ng/mL (ref 30–89)

## 2010-03-04 ENCOUNTER — Ambulatory Visit: Payer: Self-pay | Admitting: Oncology

## 2010-03-06 LAB — CBC WITH DIFFERENTIAL/PLATELET
BASO%: 0.5 % (ref 0.0–2.0)
Basophils Absolute: 0 10*3/uL (ref 0.0–0.1)
EOS%: 1.7 % (ref 0.0–7.0)
Eosinophils Absolute: 0.1 10*3/uL (ref 0.0–0.5)
HCT: 37.6 % (ref 34.8–46.6)
HGB: 12.8 g/dL (ref 11.6–15.9)
LYMPH%: 27.8 % (ref 14.0–49.7)
MCH: 29.9 pg (ref 25.1–34.0)
MCHC: 34 g/dL (ref 31.5–36.0)
MCV: 87.9 fL (ref 79.5–101.0)
MONO#: 0.4 10*3/uL (ref 0.1–0.9)
MONO%: 7.1 % (ref 0.0–14.0)
NEUT#: 3.4 10*3/uL (ref 1.5–6.5)
NEUT%: 62.9 % (ref 38.4–76.8)
Platelets: 297 10*3/uL (ref 145–400)
RBC: 4.27 10*6/uL (ref 3.70–5.45)
RDW: 13.6 % (ref 11.2–14.5)
WBC: 5.4 10*3/uL (ref 3.9–10.3)
lymph#: 1.5 10*3/uL (ref 0.9–3.3)

## 2010-03-07 LAB — COMPREHENSIVE METABOLIC PANEL
ALT: 17 U/L (ref 0–35)
AST: 21 U/L (ref 0–37)
Albumin: 4.5 g/dL (ref 3.5–5.2)
Alkaline Phosphatase: 42 U/L (ref 39–117)
BUN: 13 mg/dL (ref 6–23)
CO2: 29 mEq/L (ref 19–32)
Calcium: 9.7 mg/dL (ref 8.4–10.5)
Chloride: 103 mEq/L (ref 96–112)
Creatinine, Ser: 1.01 mg/dL (ref 0.40–1.20)
Glucose, Bld: 84 mg/dL (ref 70–99)
Potassium: 4 mEq/L (ref 3.5–5.3)
Sodium: 142 mEq/L (ref 135–145)
Total Bilirubin: 0.3 mg/dL (ref 0.3–1.2)
Total Protein: 7 g/dL (ref 6.0–8.3)

## 2010-03-07 LAB — CANCER ANTIGEN 27.29: CA 27.29: 14 U/mL (ref 0–39)

## 2010-03-07 LAB — VITAMIN D 25 HYDROXY (VIT D DEFICIENCY, FRACTURES): Vit D, 25-Hydroxy: 84 ng/mL (ref 30–89)

## 2010-03-07 LAB — LACTATE DEHYDROGENASE: LDH: 153 U/L (ref 94–250)

## 2010-05-18 ENCOUNTER — Encounter: Payer: Self-pay | Admitting: Oncology

## 2010-09-12 NOTE — Op Note (Signed)
[name], Victoria Hartman               ACCOUNT NO.:  [id number]   MEDICAL RECORD NO.:  [id number]          PATIENT TYPE:  AMB   LOCATION:  DSC                          FACILITY:  [facility]   PHYSICIAN:  Currie Paris, M.D.DATE OF BIRTH:  1960/08/02   DATE OF PROCEDURE:  02/25/2004  DATE OF DISCHARGE:                                 OPERATIVE REPORT   PREOPERATIVE DIAGNOSIS:  Unneeded Port-A-Cath.   POSTOPERATIVE DIAGNOSIS:  Unneeded Port-A-Cath.   OPERATION:  Removal of Port-A-Cath.   SURGEON:  Currie Paris, M.D.   ANESTHESIA:  Local.   CLINICAL HISTORY:  This patient has completed all of her chemo and wishes to  have her port removed.   DESCRIPTION OF PROCEDURE:  In the minor procedure room, the Port-A-Cath was  identified.  The area was anesthetized with 1% Xylocaine with epinephrine.  I waited about 10 minutes and then prepped and draped.  The old scar was  opened and the Port-A-Cath identified in the subcutaneous tissue.  The two  holding sutures were cut, the tubing backed out, and the Port-A-Cath  removed.  There was no backbleeding.   The incision was closed with some 3-0 Vicryl and Dermabond.  The patient  tolerated the procedure well, and there were no complications.      Chri   CJS/MEDQ  D:  02/25/2004  T:  02/25/2004  Job:  [id number]

## 2010-09-12 NOTE — H&P (Signed)
NAME:  BILL, MCVEY                         ACCOUNT NO.:  [id number]   MEDICAL RECORD NO.:  [id number]                   PATIENT TYPE:  INP   LOCATION:  0277                                 FACILITY:  [facility]   PHYSICIAN:  Rose Phi. Myna Hidalgo, M.D.              DATE OF BIRTH:  23-Oct-1960   DATE OF ADMISSION:  07/19/2003  DATE OF DISCHARGE:                                HISTORY & PHYSICAL   REASON FOR ADMISSION:  1. Fever/neutropenia.  2. Stage 1 (T1 N0 M0) ductal carcinoma of the right breast.   HISTORY OF PRESENT ILLNESS:  Ms. Glab is a nice 50 year old premenopausal  white female with ductal carcinoma of the right breast.  She underwent  lumpectomy.  She had negative lymph nodes.  Her tumor was receptor positive.   She subsequently was started on chemotherapy with 5-FU/epirubicin/Cytoxan.  She received her first cycle about 10 days ago.   She was in the office Monday.  Her white cell count was low.   She developed a temperature this evening.  It went up to 101.1.  She came to  the emergency room.   She had been on Levaquin at home.   She has a little bit of sore throat.  She has a little bit of dry cough.  There has been no diarrhea.  She has had no chills or rigors.  She has had  no sweats.   She now is being admitted for IV antibiotics.   PAST MEDICAL HISTORY:  Unremarkable.   ALLERGIES:  None.   ADMISSION MEDICATIONS:  Coumadin 1 mg p.o. daily and Levaquin 500 mg p.o.  daily.   SOCIAL HISTORY:  Negative for tobacco or alcohol use.   REVIEW OF SYSTEMS:  As in the History of Present Illness.   PHYSICAL EXAMINATION:  GENERAL:  A well-developed, well-nourished white  female in no obvious distress.  VITAL SIGNS:  Temperature 101.8, pulse 110, respiratory rate 20, blood  pressure 134/84.  HEAD AND NECK:  Shows normocephalic, atraumatic skull.  She has no ocular or  oral lesions.  There is some mild pharyngeal erythema.  No adenopathy noted  on her neck.  LUNGS:   Clear bilaterally.  CARDIAC:  Tachycardic but regular.  There are no murmurs, rubs, or bruits.  Port-A-Cath site is intact without any erythema or swelling.  ABDOMEN:  Soft with good bowel sounds.  There was no palpable abdominal  mass.  There is no palpable hepatosplenomegaly.  BACK:  No tenderness of the spine, ribs or hips.  EXTREMITIES:  Shows no clubbing, cyanosis, or edema.   LABORATORY STUDIES:  Sodium 137, potassium 3.6, BUN 6, creatinine 0.8,  calcium 8.9, albumin 4.  White cell count 0.6, hemoglobin 11.7, hematocrit  34.2 and platelet count is 204.   IMPRESSION:  Ms. Ramone is a 50 year old female with ductal carcinoma of the  right breast.  She now has a fever with no  white cells.   She will be admitted.  We will start her on Maxipime.  We will check blood  and urine cultures.  Chest x-ray has been done and we will look at this.   We will also give her Neupogen to help stimulate her white blood cells.  We  will get her admitted hopefully up to [address].                                               Rose Phi. Myna Hidalgo, M.D.    PRE/MEDQ  D:  07/19/2003  T:  07/19/2003  Job:  [id number]

## 2010-09-12 NOTE — Discharge Summary (Signed)
NAME:  Victoria Hartman, Victoria Hartman                         ACCOUNT NO.:  [id number]   MEDICAL RECORD NO.:  [id number]                   PATIENT TYPE:  INP   LOCATION:  0277                                 FACILITY:  [facility]   PHYSICIAN:  Rose Phi. Myna Hidalgo, M.D.              DATE OF BIRTH:  [birth year]   DATE OF ADMISSION:  07/19/2003  DATE OF DISCHARGE:  07/20/2003                                 DISCHARGE SUMMARY   __________.  Right breast.   CONDITION UPON DISCHARGE:  Stable.   ACTIVITY:  As tolerated.   DIET:  No restrictions.   FOLLOWUP:  She will be followed with Dr. Donnie Coffin as previously scheduled.   MEDICATIONS:  1. Augmentin 875 mg p.o. b.i.d. x5 days.  2. Levaquin 500 mg p.o. daily x5 days.  3. Coumadin 1 mg p.o. daily.   HOSPITAL COURSE:  Ms. Vanderhoff was admitted because of a temperature of 101.8.  She had previously received chemotherapy with FEC.  This was her first cycle  of chemotherapy.  She was admitted with white cell count of 600.  However,  she had 32% monocytes.  She was placed on Maxipime.  She had been on  Levaquin at home.  She also received Neupogen at a dose of 480 mcg subcu  daily.  Preoperative cultures were negative.  Chest x-ray was negative for  any infiltrates.  She did have a sore throat.   She did well overnight.  Her temperature had a max of 100.8.   I felt that she was stable to go home.  She was young, lived close-by, and  her white blood cell count was increasing.  On the 25th, her white cell  count was 1500.   She had no localizing findings on physical exam.  Her lungs were clear.  She  had minimal glandular erythema.  No adenopathy was noted in the neck.  Her  Port-A-Cath site was clean and without any warmth, erythema, or tenderness.                                               Rose Phi. Myna Hidalgo, M.D.    PRE/MEDQ  D:  07/20/2003  T:  07/20/2003  Job:  [id number]

## 2011-03-10 ENCOUNTER — Other Ambulatory Visit: Payer: Self-pay | Admitting: Oncology

## 2011-03-10 ENCOUNTER — Other Ambulatory Visit (HOSPITAL_BASED_OUTPATIENT_CLINIC_OR_DEPARTMENT_OTHER): Payer: 59 | Admitting: Lab

## 2011-03-10 DIAGNOSIS — C50919 Malignant neoplasm of unspecified site of unspecified female breast: Secondary | ICD-10-CM

## 2011-03-10 LAB — CBC WITH DIFFERENTIAL/PLATELET
BASO%: 0.8 % (ref 0.0–2.0)
Basophils Absolute: 0 10*3/uL (ref 0.0–0.1)
EOS%: 2.3 % (ref 0.0–7.0)
Eosinophils Absolute: 0.1 10*3/uL (ref 0.0–0.5)
HCT: 39.1 % (ref 34.8–46.6)
HGB: 13.2 g/dL (ref 11.6–15.9)
LYMPH%: 33 % (ref 14.0–49.7)
MCH: 29.9 pg (ref 25.1–34.0)
MCHC: 33.8 g/dL (ref 31.5–36.0)
MCV: 88.5 fL (ref 79.5–101.0)
MONO#: 0.4 10*3/uL (ref 0.1–0.9)
MONO%: 8.3 % (ref 0.0–14.0)
NEUT#: 3 10*3/uL (ref 1.5–6.5)
NEUT%: 55.6 % (ref 38.4–76.8)
Platelets: 268 10*3/uL (ref 145–400)
RBC: 4.42 10*6/uL (ref 3.70–5.45)
RDW: 13.6 % (ref 11.2–14.5)
WBC: 5.3 10*3/uL (ref 3.9–10.3)
lymph#: 1.8 10*3/uL (ref 0.9–3.3)

## 2011-03-11 LAB — COMPREHENSIVE METABOLIC PANEL
ALT: 26 U/L (ref 0–35)
AST: 28 U/L (ref 0–37)
Albumin: 4.7 g/dL (ref 3.5–5.2)
Alkaline Phosphatase: 45 U/L (ref 39–117)
BUN: 10 mg/dL (ref 6–23)
CO2: 29 mEq/L (ref 19–32)
Calcium: 10 mg/dL (ref 8.4–10.5)
Chloride: 102 mEq/L (ref 96–112)
Creatinine, Ser: 0.89 mg/dL (ref 0.50–1.10)
Glucose, Bld: 85 mg/dL (ref 70–99)
Potassium: 4.1 mEq/L (ref 3.5–5.3)
Sodium: 139 mEq/L (ref 135–145)
Total Bilirubin: 0.4 mg/dL (ref 0.3–1.2)
Total Protein: 7.1 g/dL (ref 6.0–8.3)

## 2011-03-11 LAB — VITAMIN D 25 HYDROXY (VIT D DEFICIENCY, FRACTURES): Vit D, 25-Hydroxy: 91 ng/mL — ABNORMAL HIGH (ref 30–89)

## 2011-03-11 LAB — CANCER ANTIGEN 27.29: CA 27.29: 18 U/mL (ref 0–39)

## 2011-03-17 ENCOUNTER — Telehealth: Payer: Self-pay | Admitting: Oncology

## 2011-03-17 ENCOUNTER — Other Ambulatory Visit: Payer: Self-pay | Admitting: Oncology

## 2011-03-17 ENCOUNTER — Ambulatory Visit (HOSPITAL_BASED_OUTPATIENT_CLINIC_OR_DEPARTMENT_OTHER): Payer: 59 | Admitting: Oncology

## 2011-03-17 DIAGNOSIS — C50919 Malignant neoplasm of unspecified site of unspecified female breast: Secondary | ICD-10-CM

## 2011-03-17 DIAGNOSIS — Z853 Personal history of malignant neoplasm of breast: Secondary | ICD-10-CM

## 2011-03-17 DIAGNOSIS — E559 Vitamin D deficiency, unspecified: Secondary | ICD-10-CM

## 2011-03-17 MED ORDER — ALENDRONATE SODIUM 35 MG PO TABS: 35.0000 mg | ORAL_TABLET | ORAL | Status: DC

## 2011-03-17 NOTE — Progress Notes (Signed)
Hematology and Oncology Follow Up Visit  Victoria Hartman [id number] 12-30-1960 50 y.o. 03/17/2011 11:44 AM   Principle Diagnosis: T1 C. N0 breast cancer status post surgery 05/17/2003, status post FEC x6, with radiation therapy completed 01/19/2004 status post 2 years of tamoxifen and currently on Aromasin.  Interim History:  Patient is doing she had a recent mammogram in March which was normal. Taking low-dose Fosamax and vitamin D. She has regular followup with her gynecologist.  Medications: I have reviewed the patient's current medications.  Allergies: No Known Allergies  Past Medical History, Surgical history, Social history, and Family History were reviewed and updated.  Review of Systems: Constitutional:  Negative for fever, chills, night sweats, anorexia, weight loss, pain. Cardiovascular: no chest pain or dyspnea on exertion Respiratory: no cough, shortness of breath, or wheezing Neurological: no TIA or stroke symptoms Dermatological: negative ENT: negative Skin Gastrointestinal: no abdominal pain, change in bowel habits, or black or bloody stools Genito-Urinary: no dysuria, trouble voiding, or hematuria Hematological and Lymphatic: negative Breast: negative for breast lumps Musculoskeletal: negative Remaining ROS negative.  Physical Exam: Blood pressure 170/111, pulse 118, temperature 98.4 F (36.9 C), temperature source Oral, height 4\' 10"  (1.473 m), weight 123 lb 14.4 oz (56.201 kg). ECOG:  General appearance: alert, cooperative and appears stated age Head: Normocephalic, without obvious abnormality, atraumatic Neck: no adenopathy, no carotid bruit, no JVD, supple, symmetrical, trachea midline and thyroid not enlarged, symmetric, no tenderness/mass/nodules Lymph nodes: Cervical, supraclavicular, and axillary nodes normal. Cardiac : Normal Pulmonary: Normal Breasts: Both breasts free of any obvious masses, no nipple retraction or skin changes, both axilla  negative Abdomen: Negative for organomegaly or masses Extremities no edema cyanosis or clubbing Neuro: Normal  Lab Results: Lab Results  Component Value Date   WBC 5.3 03/10/2011   HGB 13.2 03/10/2011   HCT 39.1 03/10/2011   MCV 88.5 03/10/2011   PLT 268 03/10/2011     Chemistry      Component Value Date/Time   NA 139 03/10/2011 1344   NA 139 03/10/2011 1344   NA 139 03/10/2011 1344   NA 139 03/10/2011 1344   K 4.1 03/10/2011 1344   K 4.1 03/10/2011 1344   K 4.1 03/10/2011 1344   K 4.1 03/10/2011 1344   CL 102 03/10/2011 1344   CL 102 03/10/2011 1344   CL 102 03/10/2011 1344   CL 102 03/10/2011 1344   CO2 29 03/10/2011 1344   CO2 29 03/10/2011 1344   CO2 29 03/10/2011 1344   CO2 29 03/10/2011 1344   BUN 10 03/10/2011 1344   BUN 10 03/10/2011 1344   BUN 10 03/10/2011 1344   BUN 10 03/10/2011 1344   CREATININE 0.89 03/10/2011 1344   CREATININE 0.89 03/10/2011 1344   CREATININE 0.89 03/10/2011 1344   CREATININE 0.89 03/10/2011 1344      Component Value Date/Time   CALCIUM 10.0 03/10/2011 1344   CALCIUM 10.0 03/10/2011 1344   CALCIUM 10.0 03/10/2011 1344   CALCIUM 10.0 03/10/2011 1344   ALKPHOS 45 03/10/2011 1344   ALKPHOS 45 03/10/2011 1344   ALKPHOS 45 03/10/2011 1344   ALKPHOS 45 03/10/2011 1344   AST 28 03/10/2011 1344   AST 28 03/10/2011 1344   AST 28 03/10/2011 1344   AST 28 03/10/2011 1344   ALT 26 03/10/2011 1344   ALT 26 03/10/2011 1344   ALT 26 03/10/2011 1344   ALT 26 03/10/2011 1344   BILITOT 0.4 03/10/2011 1344   BILITOT 0.4 03/10/2011 1344  BILITOT 0.4 03/10/2011 1344   BILITOT 0.4 03/10/2011 1344       Radiological Studies: chest X-ray n/a Mammogram Due in 3/13 Bone density Due in 2013  Impression and Plan: Agent is doing well. I will see her in a years time. I recommended that she see Aromasin as it is now been 5 years. I would reduce her vitamin D intake 1000 minutes per day. I have renewed her f Fosamax with the idea of  repeating a bone density test in March of next year. She has regular followup with her gynecologist, I will see her in a years time. Her mammogram has only been scheduled. More than 50% of the visit was spent in patient-related counselling   Pierce Crane, MD 11/20/201211:44 AM

## 2011-03-17 NOTE — Telephone Encounter (Signed)
Gv pt appt for nov2013 °

## 2011-11-17 ENCOUNTER — Other Ambulatory Visit: Payer: Self-pay | Admitting: Dermatology

## 2012-01-26 ENCOUNTER — Ambulatory Visit (INDEPENDENT_AMBULATORY_CARE_PROVIDER_SITE_OTHER): Payer: 59

## 2012-01-26 ENCOUNTER — Ambulatory Visit (INDEPENDENT_AMBULATORY_CARE_PROVIDER_SITE_OTHER): Payer: 59 | Admitting: Obstetrics and Gynecology

## 2012-01-26 ENCOUNTER — Encounter: Payer: Self-pay | Admitting: Obstetrics and Gynecology

## 2012-01-26 VITALS — BP 132/82 | HR 90 | Resp 16 | Ht <= 58 in | Wt 123.0 lb

## 2012-01-26 DIAGNOSIS — M8580 Other specified disorders of bone density and structure, unspecified site: Secondary | ICD-10-CM | POA: Insufficient documentation

## 2012-01-26 DIAGNOSIS — M949 Disorder of cartilage, unspecified: Secondary | ICD-10-CM

## 2012-01-26 DIAGNOSIS — M899 Disorder of bone, unspecified: Secondary | ICD-10-CM

## 2012-01-26 DIAGNOSIS — C50919 Malignant neoplasm of unspecified site of unspecified female breast: Secondary | ICD-10-CM

## 2012-01-26 NOTE — Progress Notes (Signed)
Last Pap: 01/22/2010 WNL: Yes Regular Periods:no Contraception: Post-Menopause  Monthly Breast exam:yes Tetanus<36yrs:no Nl.Bladder Function:no Daily BMs:yes Healthy Diet:yes Calcium:yes Mammogram:yes Date of Mammogram: 07/15/2011 Exercise:yes Have often Exercise: on and off per pt no set schedule on working out. Seatbelt: yes Abuse at home: no Stressful work:yes Sigmoid-colonoscopy: 09/2009 ""WNL" @ [facility] Bone Density: Yes 2011 Osteopenia PCP: Dr. Barbaraann Barthel Change in PMH: No Changes Change in ZOX:WRUEAVW Brother Diagnosed  w/ prostate cancer (42 yrs old).   Subjective:    Victoria Hartman is a 51 y.o. female G0P0000 who presents for annual exam.  The patient has no complaints today.   The following portions of the patient's history were reviewed and updated as appropriate: allergies, current medications, past family history, past medical history, past social history, past surgical history and problem list.  Review of Systems Pertinent items are noted in HPI. Gastrointestinal:No change in bowel habits, no abdominal pain, no rectal bleeding Genitourinary:negative for dysuria, frequency, hematuria, nocturia and urinary incontinence    Objective:     BP 132/82  Ht 4\' 9"  (1.448 m)  Wt 123 lb (55.792 kg)  BMI 26.62 kg/m2  Weight:  Wt Readings from Last 1 Encounters:  01/26/12 123 lb (55.792 kg)     BMI: Body mass index is 26.62 kg/(m^2). General Appearance: Alert, appropriate appearance for age. No acute distress HEENT: Grossly normal Neck / Thyroid: Supple, no masses, nodes or enlargement Lungs: clear to auscultation bilaterally Back: No CVA tenderness Breast Exam: fibrocystic changes bilaterally.  Well healed incisions.  No dominant masses Cardiovascular: Regular rate and rhythm. S1, S2, no murmur Gastrointestinal: Soft, non-tender, no masses or organomegaly Pelvic Exam: External genitalia: normal general appearance Vaginal: atrophic mucosa Cervix: atrophic with  stenotic os Adnexa: non palpable Uterus: normal single, nontender Rectovaginal: normal rectal, no masses Lymphatic Exam: Non-palpable nodes in neck, clavicular, axillary, or inguinal regions Skin: no rash or abnormalities Neurologic: Normal gait and speech, no tremor  Psychiatric: Alert and oriented, appropriate affect.    Urinalysis:Not done    Assessment:    Hx Breast ca, now NED   osteopenia  Plan:   mammogram pap smear due 2014 return annually or prn DXA Continue Fosamax Follow-up:  for annual exam

## 2012-02-18 ENCOUNTER — Encounter: Payer: Self-pay | Admitting: Obstetrics and Gynecology

## 2012-02-18 NOTE — Progress Notes (Signed)
Pt got her flu shot done at [facility] on 02/17/2012 im left upper arm .

## 2012-03-08 ENCOUNTER — Other Ambulatory Visit (HOSPITAL_BASED_OUTPATIENT_CLINIC_OR_DEPARTMENT_OTHER): Payer: 59

## 2012-03-08 DIAGNOSIS — C50919 Malignant neoplasm of unspecified site of unspecified female breast: Secondary | ICD-10-CM

## 2012-03-08 DIAGNOSIS — E559 Vitamin D deficiency, unspecified: Secondary | ICD-10-CM

## 2012-03-08 LAB — CBC WITH DIFFERENTIAL/PLATELET
BASO%: 1 % (ref 0.0–2.0)
Basophils Absolute: 0.1 10*3/uL (ref 0.0–0.1)
EOS%: 2.1 % (ref 0.0–7.0)
Eosinophils Absolute: 0.1 10*3/uL (ref 0.0–0.5)
HCT: 36.4 % (ref 34.8–46.6)
HGB: 12.5 g/dL (ref 11.6–15.9)
LYMPH%: 35.7 % (ref 14.0–49.7)
MCH: 30.3 pg (ref 25.1–34.0)
MCHC: 34.3 g/dL (ref 31.5–36.0)
MCV: 88.2 fL (ref 79.5–101.0)
MONO#: 0.5 10*3/uL (ref 0.1–0.9)
MONO%: 8.9 % (ref 0.0–14.0)
NEUT#: 3 10*3/uL (ref 1.5–6.5)
NEUT%: 52.3 % (ref 38.4–76.8)
Platelets: 310 10*3/uL (ref 145–400)
RBC: 4.12 10*6/uL (ref 3.70–5.45)
RDW: 13.3 % (ref 11.2–14.5)
WBC: 5.8 10*3/uL (ref 3.9–10.3)
lymph#: 2.1 10*3/uL (ref 0.9–3.3)

## 2012-03-08 LAB — COMPREHENSIVE METABOLIC PANEL (CC13)
ALT: 22 U/L (ref 0–55)
AST: 22 U/L (ref 5–34)
Albumin: 3.9 g/dL (ref 3.5–5.0)
Alkaline Phosphatase: 56 U/L (ref 40–150)
BUN: 12 mg/dL (ref 7.0–26.0)
CO2: 27 mEq/L (ref 22–29)
Calcium: 9.3 mg/dL (ref 8.4–10.4)
Chloride: 105 mEq/L (ref 98–107)
Creatinine: 1 mg/dL (ref 0.6–1.1)
Glucose: 82 mg/dl (ref 70–99)
Potassium: 3.7 mEq/L (ref 3.5–5.1)
Sodium: 139 mEq/L (ref 136–145)
Total Bilirubin: 0.28 mg/dL (ref 0.20–1.20)
Total Protein: 6.9 g/dL (ref 6.4–8.3)

## 2012-03-09 LAB — VITAMIN D 25 HYDROXY (VIT D DEFICIENCY, FRACTURES): Vit D, 25-Hydroxy: 63 ng/mL (ref 30–89)

## 2012-03-15 ENCOUNTER — Ambulatory Visit (HOSPITAL_BASED_OUTPATIENT_CLINIC_OR_DEPARTMENT_OTHER): Payer: 59 | Admitting: Oncology

## 2012-03-15 ENCOUNTER — Telehealth: Payer: Self-pay | Admitting: Oncology

## 2012-03-15 VITALS — BP 152/98 | HR 111 | Temp 98.9°F | Resp 20 | Ht <= 58 in | Wt 123.2 lb

## 2012-03-15 DIAGNOSIS — C50919 Malignant neoplasm of unspecified site of unspecified female breast: Secondary | ICD-10-CM

## 2012-03-15 DIAGNOSIS — C50519 Malignant neoplasm of lower-outer quadrant of unspecified female breast: Secondary | ICD-10-CM

## 2012-03-15 DIAGNOSIS — M8580 Other specified disorders of bone density and structure, unspecified site: Secondary | ICD-10-CM

## 2012-03-15 DIAGNOSIS — Z170 Estrogen receptor positive status [ER+]: Secondary | ICD-10-CM

## 2012-03-15 DIAGNOSIS — E559 Vitamin D deficiency, unspecified: Secondary | ICD-10-CM

## 2012-03-15 MED ORDER — ALENDRONATE SODIUM 35 MG PO TABS: 35.0000 mg | ORAL_TABLET | ORAL | Status: DC

## 2012-03-15 NOTE — Progress Notes (Signed)
Hematology and Oncology Follow Up Visit  Victoria Hartman [id number] [birth year] 51 y.o. 03/15/2012 10:24 AM   Principle Diagnosis: T1 C. N0 breast cancer status post surgery 05/17/2003, status post FEC x6, with radiation therapy completed 01/19/2004 status post 2 years of tamoxifen and currently on Aromasin.  Interim History:  Patient is doing she had a recent mammogram in March which was normal. Taking low-dose Fosamax and vitamin D. She has regular followup with her gynecologist.  Medications: I have reviewed the patient's current medications.  Allergies: No Known Allergies  Past Medical History, Surgical history, Social history, and Family History were reviewed and updated.  Review of Systems: Constitutional:  Negative for fever, chills, night sweats, anorexia, weight loss, pain. Cardiovascular: no chest pain or dyspnea on exertion Respiratory: no cough, shortness of breath, or wheezing Neurological: no TIA or stroke symptoms Dermatological: negative ENT: negative Skin Gastrointestinal: no abdominal pain, change in bowel habits, or black or bloody stools Genito-Urinary: no dysuria, trouble voiding, or hematuria Hematological and Lymphatic: negative Breast: negative for breast lumps Musculoskeletal: negative Remaining ROS negative.  Physical Exam: Blood pressure 152/98, pulse 111, temperature 98.9 F (37.2 C), temperature source Oral, resp. rate 20, height 4\' 9"  (1.448 m), weight 123 lb 3.2 oz (55.883 kg). ECOG:  General appearance: alert, cooperative and appears stated age Head: Normocephalic, without obvious abnormality, atraumatic Neck: no adenopathy, no carotid bruit, no JVD, supple, symmetrical, trachea midline and thyroid not enlarged, symmetric, no tenderness/mass/nodules Lymph nodes: Cervical, supraclavicular, and axillary nodes normal. Cardiac : Normal Pulmonary: Normal Breasts: Both breasts free of any obvious masses, no nipple retraction or skin changes, both  axilla negative Abdomen: Negative for organomegaly or masses Extremities no edema cyanosis or clubbing Neuro: Normal  Lab Results: Lab Results  Component Value Date   WBC 5.8 03/08/2012   HGB 12.5 03/08/2012   HCT 36.4 03/08/2012   MCV 88.2 03/08/2012   PLT 310 03/08/2012     Chemistry      Component Value Date/Time   NA 139 03/08/2012 1558   NA 139 03/10/2011 1344   NA 139 03/10/2011 1344   NA 139 03/10/2011 1344   NA 139 03/10/2011 1344   K 3.7 03/08/2012 1558   K 4.1 03/10/2011 1344   K 4.1 03/10/2011 1344   K 4.1 03/10/2011 1344   K 4.1 03/10/2011 1344   CL 105 03/08/2012 1558   CL 102 03/10/2011 1344   CL 102 03/10/2011 1344   CL 102 03/10/2011 1344   CL 102 03/10/2011 1344   CO2 27 03/08/2012 1558   CO2 29 03/10/2011 1344   CO2 29 03/10/2011 1344   CO2 29 03/10/2011 1344   CO2 29 03/10/2011 1344   BUN 12.0 03/08/2012 1558   BUN 10 03/10/2011 1344   BUN 10 03/10/2011 1344   BUN 10 03/10/2011 1344   BUN 10 03/10/2011 1344   CREATININE 1.0 03/08/2012 1558   CREATININE 0.89 03/10/2011 1344   CREATININE 0.89 03/10/2011 1344   CREATININE 0.89 03/10/2011 1344   CREATININE 0.89 03/10/2011 1344      Component Value Date/Time   CALCIUM 9.3 03/08/2012 1558   CALCIUM 10.0 03/10/2011 1344   CALCIUM 10.0 03/10/2011 1344   CALCIUM 10.0 03/10/2011 1344   CALCIUM 10.0 03/10/2011 1344   ALKPHOS 56 03/08/2012 1558   ALKPHOS 45 03/10/2011 1344   ALKPHOS 45 03/10/2011 1344   ALKPHOS 45 03/10/2011 1344   ALKPHOS 45 03/10/2011 1344   AST 22 03/08/2012 1558   AST  28 03/10/2011 1344   AST 28 03/10/2011 1344   AST 28 03/10/2011 1344   AST 28 03/10/2011 1344   ALT 22 03/08/2012 1558   ALT 26 03/10/2011 1344   ALT 26 03/10/2011 1344   ALT 26 03/10/2011 1344   ALT 26 03/10/2011 1344   BILITOT 0.28 03/08/2012 1558   BILITOT 0.4 03/10/2011 1344   BILITOT 0.4 03/10/2011 1344   BILITOT 0.4 03/10/2011 1344   BILITOT 0.4 03/10/2011 1344       Radiological  Studies: chest X-ray n/a Mammogram Due in 3/13 Bone density Due in 2013  Impression and Plan: Agent is doing well. I will see her in a years time. I recommended that she see Aromasin as it is now been 5 years. I would reduce her vitamin D intake 1000 minutes per day. I have renewed her f Fosamax with the idea of repeating a bone density test in March of next year. She has regular followup with her gynecologist, I will see her in a years time. Her mammogram has only been scheduled. More than 50% of the visit was spent in patient-related counselling   Pierce Crane, MD 11/19/201310:24 AM

## 2012-03-15 NOTE — Progress Notes (Signed)
Hematology and Oncology Follow Up Visit  Victoria Hartman [id number] 03-22-1961 51 y.o. 03/15/2012 10:41 AM   Principle Diagnosis: T1 C. N0 breast cancer status post surgery 05/17/2003, status post FEC x6, with radiation therapy completed 01/19/2004 status post 2 years of tamoxifen and currently on Aromasin.  Interim History:  Patient is doing she had a recent mammogram in March which was normal. Taking low-dose Fosamax and vitamin D. She has regular followup with her gynecologist.she has no new complaints, BP is up today. She had a recent bone density test.  Medications: I have reviewed the patient's current medications.  Allergies: No Known Allergies  Past Medical History, Surgical history, Social history, and Family History were reviewed and updated.  Review of Systems: Constitutional:  Negative for fever, chills, night sweats, anorexia, weight loss, pain. Cardiovascular: no chest pain or dyspnea on exertion Respiratory: no cough, shortness of breath, or wheezing Neurological: no TIA or stroke symptoms Dermatological: negative ENT: negative Skin Gastrointestinal: no abdominal pain, change in bowel habits, or black or bloody stools Genito-Urinary: no dysuria, trouble voiding, or hematuria Hematological and Lymphatic: negative Breast: negative for breast lumps Musculoskeletal: negative Remaining ROS negative.  Physical Exam: Blood pressure 152/98, pulse 111, temperature 98.9 F (37.2 C), temperature source Oral, resp. rate 20, height 4\' 9"  (1.448 m), weight 123 lb 3.2 oz (55.883 kg). ECOG:  General appearance: alert, cooperative and appears stated age Head: Normocephalic, without obvious abnormality, atraumatic Neck: no adenopathy, no carotid bruit, no JVD, supple, symmetrical, trachea midline and thyroid not enlarged, symmetric, no tenderness/mass/nodules Lymph nodes: Cervical, supraclavicular, and axillary nodes normal. Cardiac : Normal Pulmonary: Normal Breasts: Both  breasts free of any obvious masses, no nipple retraction or skin changes, both axilla negative Abdomen: Negative for organomegaly or masses Extremities no edema cyanosis or clubbing Neuro: Normal  Lab Results: Lab Results  Component Value Date   WBC 5.8 03/08/2012   HGB 12.5 03/08/2012   HCT 36.4 03/08/2012   MCV 88.2 03/08/2012   PLT 310 03/08/2012     Chemistry      Component Value Date/Time   NA 139 03/08/2012 1558   NA 139 03/10/2011 1344   NA 139 03/10/2011 1344   NA 139 03/10/2011 1344   NA 139 03/10/2011 1344   K 3.7 03/08/2012 1558   K 4.1 03/10/2011 1344   K 4.1 03/10/2011 1344   K 4.1 03/10/2011 1344   K 4.1 03/10/2011 1344   CL 105 03/08/2012 1558   CL 102 03/10/2011 1344   CL 102 03/10/2011 1344   CL 102 03/10/2011 1344   CL 102 03/10/2011 1344   CO2 27 03/08/2012 1558   CO2 29 03/10/2011 1344   CO2 29 03/10/2011 1344   CO2 29 03/10/2011 1344   CO2 29 03/10/2011 1344   BUN 12.0 03/08/2012 1558   BUN 10 03/10/2011 1344   BUN 10 03/10/2011 1344   BUN 10 03/10/2011 1344   BUN 10 03/10/2011 1344   CREATININE 1.0 03/08/2012 1558   CREATININE 0.89 03/10/2011 1344   CREATININE 0.89 03/10/2011 1344   CREATININE 0.89 03/10/2011 1344   CREATININE 0.89 03/10/2011 1344      Component Value Date/Time   CALCIUM 9.3 03/08/2012 1558   CALCIUM 10.0 03/10/2011 1344   CALCIUM 10.0 03/10/2011 1344   CALCIUM 10.0 03/10/2011 1344   CALCIUM 10.0 03/10/2011 1344   ALKPHOS 56 03/08/2012 1558   ALKPHOS 45 03/10/2011 1344   ALKPHOS 45 03/10/2011 1344   ALKPHOS 45 03/10/2011 1344  ALKPHOS 45 03/10/2011 1344   AST 22 03/08/2012 1558   AST 28 03/10/2011 1344   AST 28 03/10/2011 1344   AST 28 03/10/2011 1344   AST 28 03/10/2011 1344   ALT 22 03/08/2012 1558   ALT 26 03/10/2011 1344   ALT 26 03/10/2011 1344   ALT 26 03/10/2011 1344   ALT 26 03/10/2011 1344   BILITOT 0.28 03/08/2012 1558   BILITOT 0.4 03/10/2011 1344   BILITOT 0.4 03/10/2011 1344   BILITOT 0.4  03/10/2011 1344   BILITOT 0.4 03/10/2011 1344       Radiological Studies: chest X-ray n/a Mammogram Due in 3/13 Bone density Due in 2013  Impression and Plan: Agent is doing well. I will see her in a years time. She is doing welll, I have renewed her fosamax. Her bone density showed some improvement as well. Her next mammogram at [facility] will be in 3/14.. I will f/u with her in 1 yr. More than 50% of the visit was spent in patient-related counselling   Pierce Crane, MD 11/19/201310:41 AM

## 2012-03-15 NOTE — Telephone Encounter (Signed)
gve the pt her nov 2014 appt calendar °

## 2012-07-16 ENCOUNTER — Encounter: Payer: Self-pay | Admitting: Oncology

## 2012-07-16 ENCOUNTER — Telehealth: Payer: Self-pay | Admitting: Oncology

## 2012-07-16 NOTE — Telephone Encounter (Signed)
s.w. pt and and advised on NOV apt...pt ok....mailed pt letter and NOV appt schedule...former pt of Dr. Donnie Coffin r/s to Dr. Newt Lukes

## 2013-03-07 ENCOUNTER — Telehealth: Payer: Self-pay | Admitting: Oncology

## 2013-03-07 NOTE — Telephone Encounter (Signed)
, °

## 2013-03-09 ENCOUNTER — Other Ambulatory Visit: Payer: Self-pay | Admitting: Emergency Medicine

## 2013-03-09 DIAGNOSIS — C50919 Malignant neoplasm of unspecified site of unspecified female breast: Secondary | ICD-10-CM

## 2013-03-10 ENCOUNTER — Other Ambulatory Visit (HOSPITAL_BASED_OUTPATIENT_CLINIC_OR_DEPARTMENT_OTHER): Payer: 59 | Admitting: Lab

## 2013-03-10 DIAGNOSIS — C50919 Malignant neoplasm of unspecified site of unspecified female breast: Secondary | ICD-10-CM

## 2013-03-10 DIAGNOSIS — C50519 Malignant neoplasm of lower-outer quadrant of unspecified female breast: Secondary | ICD-10-CM

## 2013-03-10 LAB — CBC WITH DIFFERENTIAL/PLATELET
BASO%: 1 % (ref 0.0–2.0)
Basophils Absolute: 0.1 10*3/uL (ref 0.0–0.1)
EOS%: 2 % (ref 0.0–7.0)
Eosinophils Absolute: 0.1 10*3/uL (ref 0.0–0.5)
HCT: 39.3 % (ref 34.8–46.6)
HGB: 12.9 g/dL (ref 11.6–15.9)
LYMPH%: 30.6 % (ref 14.0–49.7)
MCH: 28.7 pg (ref 25.1–34.0)
MCHC: 32.8 g/dL (ref 31.5–36.0)
MCV: 87.5 fL (ref 79.5–101.0)
MONO#: 0.4 10*3/uL (ref 0.1–0.9)
MONO%: 7.1 % (ref 0.0–14.0)
NEUT#: 2.9 10*3/uL (ref 1.5–6.5)
NEUT%: 59.3 % (ref 38.4–76.8)
Platelets: 285 10*3/uL (ref 145–400)
RBC: 4.49 10*6/uL (ref 3.70–5.45)
RDW: 13.3 % (ref 11.2–14.5)
WBC: 4.9 10*3/uL (ref 3.9–10.3)
lymph#: 1.5 10*3/uL (ref 0.9–3.3)

## 2013-03-10 LAB — COMPREHENSIVE METABOLIC PANEL (CC13)
ALT: 14 U/L (ref 0–55)
AST: 22 U/L (ref 5–34)
Albumin: 4.4 g/dL (ref 3.5–5.0)
Alkaline Phosphatase: 47 U/L (ref 40–150)
Anion Gap: 11 mEq/L (ref 3–11)
BUN: 10.4 mg/dL (ref 7.0–26.0)
CO2: 26 mEq/L (ref 22–29)
Calcium: 10 mg/dL (ref 8.4–10.4)
Chloride: 104 mEq/L (ref 98–109)
Creatinine: 0.8 mg/dL (ref 0.6–1.1)
Glucose: 91 mg/dl (ref 70–140)
Potassium: 3.7 mEq/L (ref 3.5–5.1)
Sodium: 141 mEq/L (ref 136–145)
Total Bilirubin: 0.42 mg/dL (ref 0.20–1.20)
Total Protein: 7.4 g/dL (ref 6.4–8.3)

## 2013-03-14 ENCOUNTER — Encounter (INDEPENDENT_AMBULATORY_CARE_PROVIDER_SITE_OTHER): Payer: Self-pay

## 2013-03-14 ENCOUNTER — Ambulatory Visit (HOSPITAL_BASED_OUTPATIENT_CLINIC_OR_DEPARTMENT_OTHER): Payer: 59 | Admitting: Oncology

## 2013-03-14 VITALS — BP 153/95 | HR 102 | Temp 97.9°F | Resp 20 | Ht <= 58 in | Wt 117.5 lb

## 2013-03-14 DIAGNOSIS — Z853 Personal history of malignant neoplasm of breast: Secondary | ICD-10-CM

## 2013-03-14 DIAGNOSIS — C50919 Malignant neoplasm of unspecified site of unspecified female breast: Secondary | ICD-10-CM

## 2013-03-17 ENCOUNTER — Ambulatory Visit: Payer: 59 | Admitting: Oncology

## 2013-03-26 NOTE — Progress Notes (Signed)
OFFICE PROGRESS NOTE  CC**  DIAGNOSIS: 52 year old female with history of stage I breast cancer diagnosed 2005  PRIOR THERAPY:  #1 patient underwent lumpectomy for a T1 C. Breast cancer in January 2005.  #2 she then went on to receive 6 cycles of FEC by Dr. Pierce Crane.  #3 she completed her radiation therapy 01/19/2004.  #4 patient subsequently received 2 years of adjuvant tamoxifen and then Aromasin x5 years completed in 2013  CURRENT THERAPY: observation  INTERVAL HISTORY: Victoria Hartman 52 y.o. female returns for followup visit to establish your care with me. Overall she's doing well no complaints. She is on Fosamax and vitamin D she is continuing to followup with her gynecologist as well as her primary care physician. She denies any fevers chills night sweats headaches shortness of breath chest pains palpitations no myalgias and arthralgias no vaginal bleeding. Meter of the 10 point review of systems is negative.  MEDICAL HISTORY: Past Medical History  Diagnosis Date  . Cancer     breast  . Osteopenia   . Yeast infection   . Dysplastic nevus syndrome     ALLERGIES:  has No Known Allergies.  MEDICATIONS:  Current Outpatient Prescriptions  Medication Sig Dispense Refill  . alendronate (FOSAMAX) 35 MG tablet Take 1 tablet (35 mg total) by mouth every 7 (seven) days. Take with a full glass of water on an empty stomach.  12 tablet  4  . Calcium Citrate (CITRACAL PO) Take 1 each by mouth daily.      . Cholecalciferol (VITAMIN D-3) 1000 UNITS CAPS Take 1 each by mouth daily.       No current facility-administered medications for this visit.    SURGICAL HISTORY:  Past Surgical History  Procedure Laterality Date  . Breast lumpectomy    . Mole removal    . Gynecologic cryosurgery    . Wisdom tooth extraction      REVIEW OF SYSTEMS:  A comprehensive review of systems was negative.   HEALTH MAINTENANCE:   PHYSICAL EXAMINATION: Blood pressure 153/95, pulse 102,  temperature 97.9 F (36.6 C), temperature source Oral, resp. rate 20, height 4\' 9"  (1.448 m), weight 117 lb 8 oz (53.298 kg). Body mass index is 25.42 kg/(m^2). ECOG PERFORMANCE STATUS: 0 - Asymptomatic   General appearance: alert, cooperative and appears stated age Lymph nodes: Cervical, supraclavicular, and axillary nodes normal. Resp: clear to auscultation bilaterally Cardio: regular rate and rhythm Genitalia: defer exam Extremities: extremities normal, atraumatic, no cyanosis or edema Neurologic: Grossly normal   LABORATORY DATA: Lab Results  Component Value Date   WBC 4.9 03/10/2013   HGB 12.9 03/10/2013   HCT 39.3 03/10/2013   MCV 87.5 03/10/2013   PLT 285 03/10/2013      Chemistry      Component Value Date/Time   NA 141 03/10/2013 0957   NA 139 03/10/2011 1344   K 3.7 03/10/2013 0957   K 4.1 03/10/2011 1344   CL 105 03/08/2012 1558   CL 102 03/10/2011 1344   CO2 26 03/10/2013 0957   CO2 29 03/10/2011 1344   BUN 10.4 03/10/2013 0957   BUN 10 03/10/2011 1344   CREATININE 0.8 03/10/2013 0957   CREATININE 0.89 03/10/2011 1344      Component Value Date/Time   CALCIUM 10.0 03/10/2013 0957   CALCIUM 10.0 03/10/2011 1344   ALKPHOS 47 03/10/2013 0957   ALKPHOS 45 03/10/2011 1344   AST 22 03/10/2013 0957   AST 28 03/10/2011 1344   ALT  14 03/10/2013 0957   ALT 26 03/10/2011 1344   BILITOT 0.42 03/10/2013 0957   BILITOT 0.4 03/10/2011 1344       RADIOGRAPHIC STUDIES:  No results found.  ASSESSMENT: 52 year old female with history of stage I breast cancer status post lumpectomy followed by adjuvant chemotherapy consisting of FEC for 6 cycles followed by radiation therapy. All completed 01/19/2004. She thereafter received curative intent adjuvant antiestrogen therapy with 2 years of tamoxifen followed by 5 years of Aromasin. Overall she tolerated it well without any significant problems. She is now on observation only. She is without any evidence of recurrent  disease. She will be 10 years out since her original diagnosis. I have recommended followup on an as-needed basis.   PLAN: we will see the patient back on as needed basis. In the meantime she'll continue to follow with her gynecologist as well as her family doctor. She'll continue to do self breast examinations, mammograms on an annual basis, but also recommended exercise and eating healthy and maintaining a good BMI.   All questions were answered. The patient knows to call the clinic with any problems, questions or concerns. We can certainly see the patient much sooner if necessary.  I spent 25 minutes counseling the patient face to face. The total time spent in the appointment was 30 minutes.    Drue Second, MD Medical/Oncology [facility] [phone] (beeper) [phone] (Office)

## 2013-11-13 ENCOUNTER — Emergency Department (HOSPITAL_COMMUNITY): Payer: PRIVATE HEALTH INSURANCE

## 2013-11-13 ENCOUNTER — Encounter (HOSPITAL_COMMUNITY): Payer: Self-pay | Admitting: Emergency Medicine

## 2013-11-13 ENCOUNTER — Inpatient Hospital Stay (HOSPITAL_COMMUNITY)
Admission: EM | Admit: 2013-11-13 | Discharge: 2013-11-15 | DRG: 558 | Disposition: A | Discharge status: Home / Self Care | Payer: PRIVATE HEALTH INSURANCE | Attending: Family Medicine | Admitting: Family Medicine

## 2013-11-13 DIAGNOSIS — M949 Disorder of cartilage, unspecified: Secondary | ICD-10-CM

## 2013-11-13 DIAGNOSIS — M659 Unspecified synovitis and tenosynovitis, unspecified site: Principal | ICD-10-CM | POA: Diagnosis present

## 2013-11-13 DIAGNOSIS — C50919 Malignant neoplasm of unspecified site of unspecified female breast: Secondary | ICD-10-CM

## 2013-11-13 DIAGNOSIS — R000 Tachycardia, unspecified: Secondary | ICD-10-CM

## 2013-11-13 DIAGNOSIS — A419 Sepsis, unspecified organism: Secondary | ICD-10-CM

## 2013-11-13 DIAGNOSIS — M25561 Pain in right knee: Secondary | ICD-10-CM | POA: Diagnosis present

## 2013-11-13 DIAGNOSIS — M899 Disorder of bone, unspecified: Secondary | ICD-10-CM | POA: Diagnosis present

## 2013-11-13 DIAGNOSIS — R6510 Systemic inflammatory response syndrome (SIRS) of non-infectious origin without acute organ dysfunction: Secondary | ICD-10-CM | POA: Diagnosis present

## 2013-11-13 DIAGNOSIS — I498 Other specified cardiac arrhythmias: Secondary | ICD-10-CM

## 2013-11-13 DIAGNOSIS — Z853 Personal history of malignant neoplasm of breast: Secondary | ICD-10-CM

## 2013-11-13 DIAGNOSIS — J329 Chronic sinusitis, unspecified: Secondary | ICD-10-CM | POA: Diagnosis present

## 2013-11-13 HISTORY — DX: Other specified health status: Z78.9

## 2013-11-13 LAB — CBC
HCT: 36.7 % (ref 36.0–46.0)
Hemoglobin: 12.1 g/dL (ref 12.0–15.0)
MCH: 29.2 pg (ref 26.0–34.0)
MCHC: 33 g/dL (ref 30.0–36.0)
MCV: 88.6 fL (ref 78.0–100.0)
Platelets: 323 10*3/uL (ref 150–400)
RBC: 4.14 MIL/uL (ref 3.87–5.11)
RDW: 13.8 % (ref 11.5–15.5)
WBC: 17.4 10*3/uL — ABNORMAL HIGH (ref 4.0–10.5)

## 2013-11-13 LAB — URINALYSIS, ROUTINE W REFLEX MICROSCOPIC
Bilirubin Urine: NEGATIVE
Glucose, UA: NEGATIVE mg/dL
Ketones, ur: 15 mg/dL — AB
Leukocytes, UA: NEGATIVE
Nitrite: NEGATIVE
Protein, ur: NEGATIVE mg/dL
Specific Gravity, Urine: 1.012 (ref 1.005–1.030)
Urobilinogen, UA: 0.2 mg/dL (ref 0.0–1.0)
pH: 6 (ref 5.0–8.0)

## 2013-11-13 LAB — BASIC METABOLIC PANEL
Anion gap: 12 (ref 5–15)
BUN: 17 mg/dL (ref 6–23)
CO2: 25 mEq/L (ref 19–32)
Calcium: 9.1 mg/dL (ref 8.4–10.5)
Chloride: 100 mEq/L (ref 96–112)
Creatinine, Ser: 0.81 mg/dL (ref 0.50–1.10)
GFR calc Af Amer: >90 mL/min (ref >90)
GFR calc non Af Amer: 81 mL/min — ABNORMAL LOW (ref >90)
Glucose, Bld: 124 mg/dL — ABNORMAL HIGH (ref 70–99)
Potassium: 3.7 mEq/L (ref 3.7–5.3)
Sodium: 137 mEq/L (ref 137–147)

## 2013-11-13 LAB — I-STAT CG4 LACTIC ACID, ED
Lactic Acid, Venous: 1.14 mmol/L (ref 0.5–2.2)
Lactic Acid, Venous: 1.2 mmol/L (ref 0.5–2.2)

## 2013-11-13 LAB — URINE MICROSCOPIC-ADD ON

## 2013-11-13 LAB — TSH: TSH: 2.88 u[IU]/mL (ref 0.350–4.500)

## 2013-11-13 LAB — I-STAT TROPONIN, ED: Troponin i, poc: 0.02 ng/mL (ref 0.00–0.08)

## 2013-11-13 MED ORDER — SODIUM CHLORIDE 0.9 % IV SOLN
INTRAVENOUS | Status: DC
  Administered 2013-11-13 – 2013-11-14 (×3): via INTRAVENOUS

## 2013-11-13 MED ORDER — ACETAMINOPHEN 325 MG PO TABS: 650.0000 mg | ORAL_TABLET | Freq: Four times a day (QID) | ORAL | Status: DC | PRN

## 2013-11-13 MED ORDER — SODIUM CHLORIDE 0.9 % IJ SOLN
3.0000 mL | Freq: Two times a day (BID) | INTRAMUSCULAR | Status: DC
  Administered 2013-11-13: 3 mL via INTRAVENOUS

## 2013-11-13 MED ORDER — SODIUM CHLORIDE 0.9 % IV SOLN
1000.0000 mL | INTRAVENOUS | Status: DC
  Administered 2013-11-13: 1000 mL via INTRAVENOUS

## 2013-11-13 MED ORDER — ACETAMINOPHEN 650 MG RE SUPP: 650.0000 mg | Freq: Four times a day (QID) | RECTAL | Status: DC | PRN

## 2013-11-13 MED ORDER — ACETAMINOPHEN 325 MG PO TABS
650.0000 mg | ORAL_TABLET | Freq: Once | ORAL | Status: AC
  Administered 2013-11-13: 650 mg via ORAL
  Filled 2013-11-13: qty 2

## 2013-11-13 MED ORDER — ONDANSETRON HCL 4 MG PO TABS: 4.0000 mg | ORAL_TABLET | Freq: Four times a day (QID) | ORAL | Status: DC | PRN

## 2013-11-13 MED ORDER — SODIUM CHLORIDE 0.9 % IV BOLUS (SEPSIS)
30.0000 mL/kg | Freq: Once | INTRAVENOUS | Status: AC
  Administered 2013-11-13: 1000 mL via INTRAVENOUS

## 2013-11-13 MED ORDER — ENOXAPARIN SODIUM 40 MG/0.4ML ~~LOC~~ SOLN
40.0000 mg | SUBCUTANEOUS | Status: DC
  Filled 2013-11-13: qty 0.4

## 2013-11-13 MED ORDER — DOXYCYCLINE HYCLATE 100 MG IV SOLR
100.0000 mg | Freq: Two times a day (BID) | INTRAVENOUS | Status: DC
  Administered 2013-11-14 – 2013-11-15 (×3): 100 mg via INTRAVENOUS
  Filled 2013-11-13 (×4): qty 100

## 2013-11-13 MED ORDER — HYDROCODONE-ACETAMINOPHEN 5-325 MG PO TABS: 1.0000 | ORAL_TABLET | ORAL | Status: DC | PRN

## 2013-11-13 MED ORDER — DEXTROSE 5 % IV SOLN
1.0000 g | Freq: Once | INTRAVENOUS | Status: AC
  Administered 2013-11-13: 1 g via INTRAVENOUS
  Filled 2013-11-13: qty 10

## 2013-11-13 MED ORDER — ONDANSETRON HCL 4 MG/2ML IJ SOLN: 4.0000 mg | Freq: Three times a day (TID) | INTRAMUSCULAR | Status: DC | PRN

## 2013-11-13 MED ORDER — SODIUM CHLORIDE 0.9 % IV BOLUS (SEPSIS)
1000.0000 mL | Freq: Once | INTRAVENOUS | Status: AC
  Administered 2013-11-13: 1000 mL via INTRAVENOUS

## 2013-11-13 MED ORDER — LIDOCAINE HCL (PF) 1 % IJ SOLN
5.0000 mL | Freq: Once | INTRAMUSCULAR | Status: AC
  Administered 2013-11-13: 5 mL via INTRADERMAL
  Filled 2013-11-13: qty 5

## 2013-11-13 MED ORDER — DOXYCYCLINE HYCLATE 100 MG PO TABS
100.0000 mg | ORAL_TABLET | Freq: Once | ORAL | Status: AC
  Administered 2013-11-13: 100 mg via ORAL
  Filled 2013-11-13: qty 1

## 2013-11-13 MED ORDER — SODIUM CHLORIDE 0.9 % IV SOLN
INTRAVENOUS | Status: DC
  Administered 2013-11-13: 23:00:00 via INTRAVENOUS

## 2013-11-13 MED ORDER — ONDANSETRON HCL 4 MG/2ML IJ SOLN: 4.0000 mg | Freq: Four times a day (QID) | INTRAMUSCULAR | Status: DC | PRN

## 2013-11-13 MED ORDER — DEXTROSE 5 % IV SOLN
1.0000 g | INTRAVENOUS | Status: DC
  Administered 2013-11-14: 1 g via INTRAVENOUS
  Filled 2013-11-13 (×2): qty 10

## 2013-11-13 NOTE — ED Notes (Signed)
Portable at bedside 

## 2013-11-13 NOTE — ED Provider Notes (Signed)
CSN: [id number]     Arrival date & time 11/13/13  1608 History   First MD Initiated Contact with Patient 11/13/13 1700     Chief Complaint  Patient presents with  . Knee Pain  . Abnormal Lab  . Tachycardia     (Consider location/radiation/quality/duration/timing/severity/associated sxs/prior Treatment) HPI Comments: 53 year old female with history of breast cancer years ago presents with fever, tachycardia and right knee pain. On Friday patient started developing mild right knee pain and thought it was musculoskeletal however it gradually worsened in addition to developing bodyaches, low-grade fevers up to 100.2 over the weekend. No sick contacts, no tick bites, no neck stiffness or headaches. No infectious disease history long travel. Patient tolerating by mouth okay, no belly pain. Symptoms intermittent. Mild pain with range of motion right knee with mild swelling. No history of joint disease or surgery to that knee. No other joint swelling however patient does have diffuse joint aches. No systemic diseases known.  Patient is a 53 y.o. female presenting with knee pain. The history is provided by the patient.  Knee Pain Associated symptoms: fever   Associated symptoms: no back pain and no neck pain     Past Medical History  Diagnosis Date  . Cancer     breast  . Osteopenia   . Yeast infection   . Dysplastic nevus syndrome    Past Surgical History  Procedure Laterality Date  . Breast lumpectomy    . Mole removal    . Gynecologic cryosurgery    . Wisdom tooth extraction     Family History  Problem Relation Age of Onset  . Hypertension Father   . Depression Maternal Aunt   . Depression Paternal Grandmother    History  Substance Use Topics  . Smoking status: Never Smoker   . Smokeless tobacco: Never Used  . Alcohol Use: No   OB History   Grav Para Term Preterm Abortions TAB SAB Ect Mult Living   0 0 0 0 0 0 0 0 0 0      Review of Systems  Constitutional: Positive for  fever. Negative for chills.  HENT: Negative for congestion.   Eyes: Negative for visual disturbance.  Respiratory: Positive for cough. Negative for shortness of breath.   Cardiovascular: Positive for palpitations. Negative for chest pain and leg swelling.  Gastrointestinal: Negative for vomiting and abdominal pain.  Genitourinary: Negative for dysuria and flank pain.  Musculoskeletal: Positive for arthralgias and joint swelling. Negative for back pain, neck pain and neck stiffness.  Skin: Negative for rash.  Neurological: Positive for light-headedness. Negative for headaches.      Allergies  Review of patient's allergies indicates no known allergies.  Home Medications   Prior to Admission medications   Medication Sig Start Date End Date Taking? Authorizing Provider  alendronate (FOSAMAX) 35 MG tablet Take 1 tablet (35 mg total) by mouth every 7 (seven) days. Take with a full glass of water on an empty stomach. 03/15/12   Eston Esters, MD  Calcium Citrate (CITRACAL PO) Take 1 each by mouth daily.    Historical Provider, MD  Cholecalciferol (VITAMIN D-3) 1000 UNITS CAPS Take 1 each by mouth daily.    Historical Provider, MD   BP 133/78  Pulse 135  Temp(Src) 101.6 F (38.7 C) (Rectal)  Resp 17  Wt 117 lb 8.1 oz (53.3 kg)  SpO2 99% Physical Exam  Nursing note and vitals reviewed. Constitutional: She is oriented to person, place, and time. She appears well-developed and  well-nourished.  HENT:  Head: Normocephalic and atraumatic.  Dry mucous membranes  Eyes: Conjunctivae are normal. Right eye exhibits no discharge. Left eye exhibits no discharge.  Neck: Normal range of motion. Neck supple. No tracheal deviation present.  Cardiovascular: Regular rhythm.  Tachycardia present.   Pulmonary/Chest: Effort normal and breath sounds normal.  Abdominal: Soft. She exhibits no distension. There is no tenderness. There is no guarding.  Musculoskeletal: She exhibits edema and tenderness.   Patient has mild tenderness to wrist and knee joints bilateral with mild swelling right knee and mild tenderness with flexion. Mild warmth to right knee.  Neurological: She is alert and oriented to person, place, and time.  Skin: Skin is warm. No rash noted.  Psychiatric: She has a normal mood and affect.    ED Course  Procedures (including critical care time) Right knee arthrocentesis/knee aspiration   indication right knee pain mild swelling fever Discussed risks and benefits including trauma, bleeding, injuries infection, pain, patient agrees with plan. Time out: Procedure. Betadine used in region none with lidocaine. Patient tolerated procedure well and 2 attempts done without fluid aspirated. Wound dressed afterwards.  Labs Review Labs Reviewed  CBC - Abnormal; Notable for the following:    WBC 17.4 (*)    All other components within normal limits  CULTURE, BLOOD (ROUTINE X 2)  CULTURE, BLOOD (ROUTINE X 2)  URINE CULTURE  BASIC METABOLIC PANEL  URINALYSIS, ROUTINE W REFLEX MICROSCOPIC  I-STAT TROPOININ, ED  I-STAT CG4 LACTIC ACID, ED    Imaging Review Dg Chest Port 1 View  11/13/2013   CLINICAL DATA:  Knee pain.  Tachycardia.  EXAM: PORTABLE CHEST - 1 VIEW  COMPARISON:  Chest radiograph 07/19/2003  FINDINGS: Stable cardiac and mediastinal contours. Minimal scarring and or atelectasis left lung base. No large consolidative pulmonary opacity. No pleural effusion or pneumothorax. Scoliotic curvature of the thoracolumbar spine.  IMPRESSION: Scarring and or atelectasis left lung base.   Electronically Signed   By: Lovey Newcomer M.D.   On: 11/13/2013 17:49   Dg Knee Right Port  11/13/2013   CLINICAL DATA:  53 year old female right knee pain. Medial pain. Initial encounter.  EXAM: PORTABLE RIGHT KNEE - 1-2 VIEW  COMPARISON:  None.  FINDINGS: Two portable views of the right knee. Bone mineralization is within normal limits. No joint effusion is evident. Medial greater than lateral  compartment degenerative spurring. Patella intact. No fracture or dislocation identified.  IMPRESSION: No acute osseous abnormality identified at the right knee.   Electronically Signed   By: Lars Pinks M.D.   On: 11/13/2013 19:46     EKG Interpretation   Date/Time:  Monday November 13 2013 16:30:37 EDT Ventricular Rate:  154 PR Interval:  128 QRS Duration: 68 QT Interval:  266 QTC Calculation: 426 R Axis:   61 Text Interpretation:  Sinus tachycardia Abnormal ECG Confirmed by Mizuki Hoel   MD, Leobardo Granlund ([id number]) on 11/13/2013 5:04:10 PM      MDM   Final diagnoses:  Sepsis, due to unspecified organism  Sinus tachycardia  Right knee pain   Patient presents clinically sepsis, community-acquired with differential pneumonia as patient's cough, joint disease, septic right knee/ less likely with full range of motion right knee and no other reason than a septic knee, tick disease, urinary, other. Plan for blood work, cultures, antibiotics, admission the hospital. No blood clot hx, recent surgery, active CA or sob/ cp/  Patient's heart rate mildly improved after 2 fluid boluses. Repeat fluid bolus. Rocephin and doxycycline  given in ER. Attempt of right knee aspiration with only small amount of blood. Discuss case with Dr. Posey Pronto who agrees with telemetry admission. Patient overall well-appearing on recheck In an in and right knees.  The patients results and plan were reviewed and discussed.   Any x-rays performed were personally reviewed by myself.   Differential diagnosis were considered with the presenting HPI.  Medications  sodium chloride 0.9 % bolus 30 mL/kg (0 mL/kg Intravenous Stopped 11/13/13 1917)    Followed by  0.9 %  sodium chloride infusion (1,000 mLs Intravenous New Bag/Given 11/13/13 1930)  0.9 %  sodium chloride infusion (not administered)  ondansetron (ZOFRAN) injection 4 mg (not administered)  cefTRIAXone (ROCEPHIN) 1 g in dextrose 5 % 50 mL IVPB (0 g Intravenous Stopped 11/13/13 1815)   sodium chloride 0.9 % bolus 1,000 mL (0 mLs Intravenous Stopped 11/13/13 1917)  doxycycline (VIBRA-TABS) tablet 100 mg (100 mg Oral Given 11/13/13 1812)  acetaminophen (TYLENOL) tablet 650 mg (650 mg Oral Given 11/13/13 1929)  lidocaine (PF) (XYLOCAINE) 1 % injection 5 mL (5 mLs Intradermal Given 11/13/13 1935)  sodium chloride 0.9 % bolus 1,000 mL (0 mLs Intravenous Stopped 11/13/13 2149)     Filed Vitals:   11/13/13 1813 11/13/13 1815 11/13/13 2015 11/13/13 2130  BP: 135/77 134/83 108/64 111/64  Pulse: 126 122 129 116  Temp:   100 F (37.8 C)   TempSrc:   Oral   Resp: 13 18 22 20   Weight:      SpO2: 99% 99% 99% 100%    Admission/ observation were discussed with the admitting physician, patient and/or family and they are comfortable with the plan.       Mariea Clonts, MD 11/13/13 2217

## 2013-11-13 NOTE — ED Notes (Signed)
Pt endorses right knee pain that started Friday night, sts she tried getting up and right knee was painful. sts pain has been increasing over the past few days. Pt sts worse with movement, non-tender. Pt also endorses mild cough started yesterday with yellow-white phlegm. Pt denies n/v/d, SOB, CP.

## 2013-11-13 NOTE — ED Notes (Signed)
Pt here from PCP with elevated WBC, right knee pain and fever; pt noted to be tachycardic at present; pt appears anxious

## 2013-11-13 NOTE — ED Notes (Signed)
Dr. Reather Converse unable to get fluid off of right knee.

## 2013-11-13 NOTE — H&P (Signed)
[facility] History and Physical  Patient: Victoria Hartman  MMH:[id number]  DOB: 04/02/1961  DOS: the patient was seen and examined on 11/13/2013 PCP: Pcp Not In System  Chief Complaint: Knee pain  HPI: ELLEY HARP is a 53 y.o. female with Past medical history of breast cancer. Patient presented with complaints of right knee pain. She mentions that she was at her baseline but since last 2 weeks she has been having on and off sinus infection with runny nose and congestion. She denies any shortness of breath she had some cough in the last 1 or 2 days. She denies any chest pain palpitation. She denies any dizziness or lightheadedness. She complains of some nausea but no vomiting. No abdominal pain acid reflux diarrhea constipation or burning urination. No recent travel sick contacts or immobilization. No rash. 3 days ago while she was sitting in her college and tried to stand up she suddenly started having complaints of pain in her right knee. The pain was located on the sides of the knee and was making her difficult to climb the stairs. The pain progressively got worse for 3 days. She also had some swelling and redness of the knee. She denies any similar symptoms in the past. She denies any other joint pains. When she woke up in the morning this last day he started having some generalized soreness. She has been having generalized weakness over last few days as well.  The patient is coming from home. And at her baseline independent for most of her ADL.  Review of Systems: as mentioned in the history of present illness.  A Comprehensive review of the other systems is negative.  Past Medical History  Diagnosis Date  . Cancer     breast  . Osteopenia   . Yeast infection   . Dysplastic nevus syndrome    Past Surgical History  Procedure Laterality Date  . Breast lumpectomy    . Mole removal    . Gynecologic cryosurgery    . Wisdom tooth extraction     Social History:  reports that  she has never smoked. She has never used smokeless tobacco. She reports that she does not drink alcohol or use illicit drugs.  No Known Allergies  Family History  Problem Relation Age of Onset  . Hypertension Father   . Depression Maternal Aunt   . Depression Paternal Grandmother     Prior to Admission medications   Medication Sig Start Date End Date Taking? Authorizing Provider  calcium citrate-vitamin D (CITRACAL+D) 315-200 MG-UNIT per tablet Take 1 tablet by mouth 2 (two) times daily.   Yes Historical Provider, MD  Cholecalciferol (VITAMIN D-3) 1000 UNITS CAPS Take 1 each by mouth daily.   Yes Historical Provider, MD    Physical Exam: Filed Vitals:   11/13/13 1815 11/13/13 2015 11/13/13 2130 11/13/13 2246  BP: 134/83 108/64 111/64 121/78  Pulse: 122 129 116 116  Temp:  100 F (37.8 C)  99 F (37.2 C)  TempSrc:  Oral  Oral  Resp: _0 Height:    4' 9" (1.448 m)  Weight:    53.842 kg (118 lb 11.2 oz)  SpO2: 99% 99% 100% 98%    General: Alert, Awake and Oriented to Time, Place and Person. Appear in mild distress Eyes: PERRL ENT: Oral Mucosa clear moist Neck: no JVD Cardiovascular: S1 and S2 Present, no Murmur, Peripheral Pulses Present Respiratory: Bilateral Air entry equal and Decreased, Clear to Auscultation, noCrackles, no  wheezes Abdomen: Bowel Sound Present, Soft and noNon tender Skin: no Rash Extremities: Right knee red and warm more, mild redness bilaterally, no Pedal edema, no calf tenderness Neurologic: Grossly no focal neuro deficit.  Labs on Admission:  CBC:  Recent Labs Lab 11/13/13 1645  WBC 17.4*  HGB 12.1  HCT 36.7  MCV 88.6  PLT 323    CMP     Component Value Date/Time   NA 137 11/13/2013 1645   NA 141 03/10/2013 0957   K 3.7 11/13/2013 1645   K 3.7 03/10/2013 0957   CL 100 11/13/2013 1645   CL 105 03/08/2012 1558   CO2 25 11/13/2013 1645   CO2 26 03/10/2013 0957   GLUCOSE 124* 11/13/2013 1645   GLUCOSE 91 03/10/2013 0957    GLUCOSE 82 03/08/2012 1558   BUN 17 11/13/2013 1645   BUN 10.4 03/10/2013 0957   CREATININE 0.81 11/13/2013 1645   CREATININE 0.8 03/10/2013 0957   CALCIUM 9.1 11/13/2013 1645   CALCIUM 10.0 03/10/2013 0957   PROT 7.4 03/10/2013 0957   PROT 7.1 03/10/2011 1344   ALBUMIN 4.4 03/10/2013 0957   ALBUMIN 4.7 03/10/2011 1344   AST 22 03/10/2013 0957   AST 28 03/10/2011 1344   ALT 14 03/10/2013 0957   ALT 26 03/10/2011 1344   ALKPHOS 47 03/10/2013 0957   ALKPHOS 45 03/10/2011 1344   BILITOT 0.42 03/10/2013 0957   BILITOT 0.4 03/10/2011 1344   GFRNONAA 81* 11/13/2013 1645   GFRAA >90 11/13/2013 1645    No results found for this basename: LIPASE, AMYLASE,  in the last 168 hours No results found for this basename: AMMONIA,  in the last 168 hours  No results found for this basename: CKTOTAL, CKMB, CKMBINDEX, TROPONINI,  in the last 168 hours BNP (last 3 results) No results found for this basename: PROBNP,  in the last 8760 hours  Radiological Exams on Admission: Dg Chest Port 1 View  11/13/2013   CLINICAL DATA:  Knee pain.  Tachycardia.  EXAM: PORTABLE CHEST - 1 VIEW  COMPARISON:  Chest radiograph 07/19/2003  FINDINGS: Stable cardiac and mediastinal contours. Minimal scarring and or atelectasis left lung base. No large consolidative pulmonary opacity. No pleural effusion or pneumothorax. Scoliotic curvature of the thoracolumbar spine.  IMPRESSION: Scarring and or atelectasis left lung base.   Electronically Signed   By: Lovey Newcomer M.D.   On: 11/13/2013 17:49   Dg Knee Right Port  11/13/2013   CLINICAL DATA:  53 year old female right knee pain. Medial pain. Initial encounter.  EXAM: PORTABLE RIGHT KNEE - 1-2 VIEW  COMPARISON:  None.  FINDINGS: Two portable views of the right knee. Bone mineralization is within normal limits. No joint effusion is evident. Medial greater than lateral compartment degenerative spurring. Patella intact. No fracture or dislocation identified.  IMPRESSION: No acute  osseous abnormality identified at the right knee.   Electronically Signed   By: Lars Pinks M.D.   On: 11/13/2013 19:46    Assessment/Plan Principal Problem:   SIRS (systemic inflammatory response syndrome) Active Problems:   Breast cancer   Right knee pain   Sinusitis   1. SIRS (systemic inflammatory response syndrome) Right knee pain  Patient presents with complaints of right knee pain and generalized fatigue. She is found to have tachycardia, tachypnea, leukocytosis, fever with which she has criteria for source. With possible source of her right knee infection she could meet the criteria for sepsis as well. With her only complaint being the right knee  and a normal chest x-ray normal UA. Currently she is being admitted in the hospital and suspected septic arthritis versus inflammatory arthritis. She is being broadly cover with ceftriaxone and doxycycline. We will follow routine cultures. ER physician has attempted arthrocentesis without any benefit therefore Orthopedic need to be consulted in the morning for further workup. Patient may need an MRI as well. Check ESR and CRP as well as CPK. Pain management with oral pain medications at present are IV hydration.  DVT Prophylaxis: subcutaneous Heparin Nutrition: Regular diet  Code Status: Full  Family Communication: Family  was present at bedside, opportunity was given to ask question and all questions were answered satisfactorily at the time of interview. Disposition: Admitted to inpatient in telemetry unit.  Author: Berle Mull, MD [facility] Pager: [phone] 11/13/2013, 11:29 PM    If 7PM-7AM, please contact night-coverage [email or web address] Password [email or web address]  **Disclaimer: This note may have been dictated with voice recognition software. Similar sounding words can inadvertently be transcribed and this note may contain transcription errors which may not have been corrected upon publication of note.**

## 2013-11-13 NOTE — ED Notes (Signed)
Dr Zavitz at bedside  

## 2013-11-14 ENCOUNTER — Inpatient Hospital Stay (HOSPITAL_COMMUNITY): Payer: PRIVATE HEALTH INSURANCE

## 2013-11-14 ENCOUNTER — Encounter (HOSPITAL_COMMUNITY): Payer: Self-pay | Admitting: *Deleted

## 2013-11-14 DIAGNOSIS — C50919 Malignant neoplasm of unspecified site of unspecified female breast: Secondary | ICD-10-CM

## 2013-11-14 DIAGNOSIS — M25569 Pain in unspecified knee: Secondary | ICD-10-CM

## 2013-11-14 DIAGNOSIS — A419 Sepsis, unspecified organism: Secondary | ICD-10-CM

## 2013-11-14 LAB — COMPREHENSIVE METABOLIC PANEL
ALT: 14 U/L (ref 0–35)
AST: 18 U/L (ref 0–37)
Albumin: 2.9 g/dL — ABNORMAL LOW (ref 3.5–5.2)
Alkaline Phosphatase: 62 U/L (ref 39–117)
Anion gap: 10 (ref 5–15)
BUN: 8 mg/dL (ref 6–23)
CO2: 23 mEq/L (ref 19–32)
Calcium: 7.3 mg/dL — ABNORMAL LOW (ref 8.4–10.5)
Chloride: 107 mEq/L (ref 96–112)
Creatinine, Ser: 0.6 mg/dL (ref 0.50–1.10)
GFR calc Af Amer: >90 mL/min (ref >90)
GFR calc non Af Amer: >90 mL/min (ref >90)
Glucose, Bld: 114 mg/dL — ABNORMAL HIGH (ref 70–99)
Potassium: 4.2 mEq/L (ref 3.7–5.3)
Sodium: 140 mEq/L (ref 137–147)
Total Bilirubin: 0.4 mg/dL (ref 0.3–1.2)
Total Protein: 5.5 g/dL — ABNORMAL LOW (ref 6.0–8.3)

## 2013-11-14 LAB — CBC
HCT: 31.8 % — ABNORMAL LOW (ref 36.0–46.0)
Hemoglobin: 10.4 g/dL — ABNORMAL LOW (ref 12.0–15.0)
MCH: 29.7 pg (ref 26.0–34.0)
MCHC: 32.7 g/dL (ref 30.0–36.0)
MCV: 90.9 fL (ref 78.0–100.0)
Platelets: 268 10*3/uL (ref 150–400)
RBC: 3.5 MIL/uL — ABNORMAL LOW (ref 3.87–5.11)
RDW: 14.1 % (ref 11.5–15.5)
WBC: 14.6 10*3/uL — ABNORMAL HIGH (ref 4.0–10.5)

## 2013-11-14 LAB — RAPID STREP SCREEN (MED CTR MEBANE ONLY): Streptococcus, Group A Screen (Direct): NEGATIVE

## 2013-11-14 LAB — ROCKY MTN SPOTTED FVR AB, IGG-BLOOD: RMSF IgG: 0.1 IV

## 2013-11-14 LAB — C-REACTIVE PROTEIN: CRP: 9.3 mg/dL — ABNORMAL HIGH (ref <0.60)

## 2013-11-14 LAB — SEDIMENTATION RATE: Sed Rate: 20 mm/hr (ref 0–22)

## 2013-11-14 LAB — PROTIME-INR
INR: 1.1 (ref 0.00–1.49)
Prothrombin Time: 14.2 seconds (ref 11.6–15.2)

## 2013-11-14 MED ORDER — BUPIVACAINE HCL 0.5 % IJ SOLN
5.0000 mL | Freq: Once | INTRAMUSCULAR | Status: AC
  Administered 2013-11-14: 5 mL
  Filled 2013-11-14: qty 5

## 2013-11-14 MED ORDER — METHYLPREDNISOLONE ACETATE 40 MG/ML IJ SUSP
60.0000 mg | Freq: Once | INTRAMUSCULAR | Status: DC
  Filled 2013-11-14: qty 2

## 2013-11-14 MED ORDER — LIDOCAINE HCL 1 % IJ SOLN
10.0000 mL | Freq: Once | INTRAMUSCULAR | Status: AC
  Administered 2013-11-14: 10 mL
  Filled 2013-11-14: qty 10

## 2013-11-14 MED ORDER — METHYLPREDNISOLONE ACETATE 80 MG/ML IJ SUSP
60.0000 mg | Freq: Once | INTRAMUSCULAR | Status: DC
  Filled 2013-11-14: qty 1

## 2013-11-14 NOTE — Care Management Note (Signed)
    Page 1 of 1   11/14/2013     9:37:00 AM CARE MANAGEMENT NOTE 11/14/2013  Patient:  Victoria Hartman, Victoria Hartman   Account Number:  [id number]  Date Initiated:  11/14/2013  Documentation initiated by:  Elissa Hefty  Subjective/Objective Assessment:   adm w sirs     Action/Plan:   lives w husband   Anticipated DC Date:     Anticipated DC Plan:           Choice offered to / List presented to:             Status of service:   Medicare Important Message given?   (If response is "NO", the following Medicare IM given date fields will be blank) Date Medicare IM given:   Medicare IM given by:   Date Additional Medicare IM given:   Additional Medicare IM given by:    Discharge Disposition:    Per UR Regulation:  Reviewed for med. necessity/level of care/duration of stay  If discussed at [place] of Stay Meetings, dates discussed:    Comments:

## 2013-11-14 NOTE — Progress Notes (Addendum)
[facility] PROGRESS NOTE  Victoria Hartman QAE:[id number] DOB: [birth year] DOA: 11/13/2013 PCP: Pcp Not In System  Brief Narrative: 53/F with prior h/o breast CA, admitted overnight with fever and SIRS. Also noted to have R knee pain and symptoms for URI, cough for last 1-2weeks, now with myalgias, arthralgias, no rash. EDP attempted R knee arthrocentesis, unsuccessfully, Ortho consulted  Assessment/Plan: 1. Fever/SIRS -clinically I suspect source is URI or bronchitis, will repeat CXR -given concern of R knee infection per EDP and Admitting MD will ask ORtho to eval, d/w dr.Handy -continue IVF, Ceftriaxone -FU CXR -also check Lyme and RMSF titres given diffuse arthralgias/myalgias  2. R knee pain and swelling -suspect related to arthralgia and myalgias from 1 -Ortho consulted as noted above, EDP attempted R knee aspiration  3. H/o Breast CA  DVt proph: will stop lovenox in case re-aspiration required  Code Status: Full Code Family Communication: none at bedside Disposition Plan: home tomorrow if stable   Consultants:  Ortho-Dr.Handy  Antibiotics:  Ceftriaxone/Doxy  HPI/Subjective: Feels better, Some discomfort of R knee with certain movements  Objective: Filed Vitals:   11/14/13 0551  BP: 124/79  Pulse: 122  Temp: 97.9 F (36.6 C)  Resp: 20    Intake/Output Summary (Last 24 hours) at 11/14/13 1021 Last data filed at 11/14/13 0552  Gross per 24 hour  Intake      0 ml  Output   1000 ml  Net  -1000 ml   Filed Weights   11/13/13 1654 11/13/13 2246 11/14/13 0551  Weight: 53.3 kg (117 lb 8.1 oz) 53.842 kg (118 lb 11.2 oz) 53.3 kg (117 lb 8.1 oz)    Exam:   General:  AAOx3  Cardiovascular: S1S2/RRR  Respiratory: CTAB  Abdomen: soft, NT, BS present  Musculoskeletal: mild R knee swelling, good RoM, no erythema or significant warmth   Data Reviewed: Basic Metabolic Panel:  Recent Labs Lab 11/13/13 1645 11/14/13 0410  NA 137 140  K 3.7 4.2   CL 100 107  CO2 25 23  GLUCOSE 124* 114*  BUN 17 8  CREATININE 0.81 0.60  CALCIUM 9.1 7.3*   Liver Function Tests:  Recent Labs Lab 11/14/13 0410  AST 18  ALT 14  ALKPHOS 62  BILITOT 0.4  PROT 5.5*  ALBUMIN 2.9*   No results found for this basename: LIPASE, AMYLASE,  in the last 168 hours No results found for this basename: AMMONIA,  in the last 168 hours CBC:  Recent Labs Lab 11/13/13 1645 11/14/13 0410  WBC 17.4* 14.6*  HGB 12.1 10.4*  HCT 36.7 31.8*  MCV 88.6 90.9  PLT 323 268   Cardiac Enzymes: No results found for this basename: CKTOTAL, CKMB, CKMBINDEX, TROPONINI,  in the last 168 hours BNP (last 3 results) No results found for this basename: PROBNP,  in the last 8760 hours CBG: No results found for this basename: GLUCAP,  in the last 168 hours  Recent Results (from the past 240 hour(s))  RAPID STREP SCREEN     Status: None   Collection Time    11/13/13 11:52 PM      Result Value Ref Range Status   Streptococcus, Group A Screen (Direct) NEGATIVE  NEGATIVE Final   Comment: (NOTE)     A Rapid Antigen test may result negative if the antigen level in the     sample is below the detection level of this test. The FDA has not     cleared this test as a stand-alone  test therefore the rapid antigen     negative result has reflexed to a Group A Strep culture.     Studies: Dg Chest Port 1 View  11/13/2013   CLINICAL DATA:  Knee pain.  Tachycardia.  EXAM: PORTABLE CHEST - 1 VIEW  COMPARISON:  Chest radiograph 07/19/2003  FINDINGS: Stable cardiac and mediastinal contours. Minimal scarring and or atelectasis left lung base. No large consolidative pulmonary opacity. No pleural effusion or pneumothorax. Scoliotic curvature of the thoracolumbar spine.  IMPRESSION: Scarring and or atelectasis left lung base.   Electronically Signed   By: Lovey Newcomer M.D.   On: 11/13/2013 17:49   Dg Knee Right Port  11/13/2013   CLINICAL DATA:  53 year old female right knee pain. Medial  pain. Initial encounter.  EXAM: PORTABLE RIGHT KNEE - 1-2 VIEW  COMPARISON:  None.  FINDINGS: Two portable views of the right knee. Bone mineralization is within normal limits. No joint effusion is evident. Medial greater than lateral compartment degenerative spurring. Patella intact. No fracture or dislocation identified.  IMPRESSION: No acute osseous abnormality identified at the right knee.   Electronically Signed   By: Lars Pinks M.D.   On: 11/13/2013 19:46    Scheduled Meds: . cefTRIAXone (ROCEPHIN)  IV  1 g Intravenous Q24H  . doxycycline (VIBRAMYCIN) IV  100 mg Intravenous Q12H  . enoxaparin (LOVENOX) injection  40 mg Subcutaneous Q24H  . sodium chloride  3 mL Intravenous Q12H   Continuous Infusions: . sodium chloride 1,000 mL (11/13/13 1930)  . sodium chloride 100 mL/hr at 11/14/13 5449   Antibiotics Given (last 72 hours)   Date/Time Action Medication Dose Rate   11/13/13 1812 Given   doxycycline (VIBRA-TABS) tablet 100 mg 100 mg    11/14/13 0352 Given   doxycycline (VIBRAMYCIN) 100 mg in dextrose 5 % 250 mL IVPB 100 mg 125 mL/hr      Principal Problem:   SIRS (systemic inflammatory response syndrome) Active Problems:   Breast cancer   Right knee pain   Sinusitis    Time spent: 69min    [facility] Pager [phone]. If 7PM-7AM, please contact night-coverage at [email or web address], password [facility] 11/14/2013, 8:22 AM  LOS: 1 day

## 2013-11-14 NOTE — Procedures (Signed)
Clinician: Jari Pigg, PA-C  Specimen: no yeild  Complications: none  Intra-articular medications: 2 cc 1% lidocaine without epi and 3 cc 0.5% marcaine without epi, infiltrated skin with 3cc 2% lidocaine without epi  Description  Utilizing sterile technique the superolateral aspect of the Right knee was prepped with alcohol swab, followed by betadine swabs x 3.  The SQ tissue was then infiltrated with 3 cc of 1% lidocaine (plain).  After adequate anesthesia of the skin was achieved, an 18 G needle on a 20 cc syringe was then advanced into the knee joint. A palpable pop was appreciated upon entrance to the knee.  Unable to aspirate joint fluid, thus increasing suspicion for acute synovitis. a septic joint is a very low probability, Injected 5 cc of local cocktail (lidocaine and marcaine).  Needle removed without difficulty, dressing applied, Pt tolerated procedure well.     No specimens sent as no fluid was obtained.  Pt can participate with therapies as she can tolerate. WBAT, no ROM restrictions.  Suspect acute arthritis flare with synovitis.  Meniscal tear possible but without clinical findings or frank mechanical symptoms can hold on MRI.  Pt would benefit from acute PT and outpt PT. Will have PT review OA/PFPS program   Jari Pigg, PA-C [facility] [phone] (P)

## 2013-11-14 NOTE — Consult Note (Signed)
[facility] (OTS) Consult Note  Reason for Consult: R knee pain, concern for septic arthritis  Referring Physician: Fanny Bien, MD (hospitalist service)   HPI: Victoria Hartman is an 53 y.o.white female with 3 day history of R knee pain and a 10-14 day history of Upper respiratory symptoms such at its cough, runny nose and congestion. Patient presented to the emergency department yesterday evening after having some generalized malaise and myalgias. She is also noted generalized weakness in the last few days as well. The patient does also report soreness and stiffness in her neck as well as bilateral upper extremities in addition to her right knee.  Patient states that her knee pain started about 3 days ago. She was sitting on her couch at home curled up in a fetal position as well as a figure 4 position. She stated that this for several hours and then when she got up began to have any significant knee pain both medially and laterally in her right knee. Knee pain has stayed relatively constant over the last several days as well. Patient has not taken anything for it. She denies any history of trauma or previous knee issues. It is relieved with rest and worsened with particular movement such as going up and down stairs and twisting type movements.  She denies any recent travel. She does report that she did hike through the [employer] about 2 weeks ago.  reports numerous mosquitoes around her house. Denies finding any ticks. Denies any spider bites. Patient specifically denies rashes  Past Medical History  Diagnosis Date  . Osteopenia   . Yeast infection   . Dysplastic nevus syndrome   . Cancer     breast  . Medical history non-contributory     Past Surgical History  Procedure Laterality Date  . Breast lumpectomy    . Mole removal    . Gynecologic cryosurgery    . Wisdom tooth extraction      Family History  Problem Relation Age of Onset  . Hypertension Father   .  Depression Maternal Aunt   . Depression Paternal Grandmother   . Osteoarthritis Father     Social History:  reports that she has never smoked. She has never used smokeless tobacco. She reports that she does not drink alcohol or use illicit drugs. Works as an Mudlogger   Allergies: No Known Allergies  Medications:  Prior to Admission:  Prescriptions prior to admission  Medication Sig Dispense Refill  . calcium citrate-vitamin D (CITRACAL+D) 315-200 MG-UNIT per tablet Take 1 tablet by mouth 2 (two) times daily.      . Cholecalciferol (VITAMIN D-3) 1000 UNITS CAPS Take 1 each by mouth daily.       Scheduled: . cefTRIAXone (ROCEPHIN)  IV  1 g Intravenous Q24H  . doxycycline (VIBRAMYCIN) IV  100 mg Intravenous Q12H        . sodium chloride  3 mL Intravenous Q12H   Continuous: . sodium chloride 1,000 mL (11/13/13 1930)  . sodium chloride 100 mL/hr at 11/14/13 1962    Results for orders placed during the hospital encounter of 11/13/13 (from the past 48 hour(s))  CBC     Status: Abnormal   Collection Time    11/13/13  4:45 PM      Result Value Ref Range   WBC 17.4 (*) 4.0 - 10.5 K/uL   RBC 4.14  3.87 - 5.11 MIL/uL   Hemoglobin 12.1  12.0 - 15.0 g/dL   HCT  36.7  36.0 - 46.0 %   MCV 88.6  78.0 - 100.0 fL   MCH 29.2  26.0 - 34.0 pg   MCHC 33.0  30.0 - 36.0 g/dL   RDW 13.8  11.5 - 15.5 %   Platelets 323  150 - 400 K/uL  BASIC METABOLIC PANEL     Status: Abnormal   Collection Time    11/13/13  4:45 PM      Result Value Ref Range   Sodium 137  137 - 147 mEq/L   Potassium 3.7  3.7 - 5.3 mEq/L   Chloride 100  96 - 112 mEq/L   CO2 25  19 - 32 mEq/L   Glucose, Bld 124 (*) 70 - 99 mg/dL   BUN 17  6 - 23 mg/dL   Creatinine, Ser 0.81  0.50 - 1.10 mg/dL   Calcium 9.1  8.4 - 10.5 mg/dL   GFR calc non Af Amer 81 (*) >90 mL/min   GFR calc Af Amer >90  >90 mL/min   Comment: (NOTE)     The eGFR has been calculated using the CKD EPI equation.     This calculation has not been  validated in all clinical situations.     eGFR's persistently <90 mL/min signify possible Chronic Kidney     Disease.   Anion gap 12  5 - 15  I-STAT TROPOININ, ED     Status: None   Collection Time    11/13/13  4:53 PM      Result Value Ref Range   Troponin i, poc 0.02  0.00 - 0.08 ng/mL   Comment 3            Comment: Due to the release kinetics of cTnI,     a negative result within the first hours     of the onset of symptoms does not rule out     myocardial infarction with certainty.     If myocardial infarction is still suspected,     repeat the test at appropriate intervals.  I-STAT CG4 LACTIC ACID, ED     Status: None   Collection Time    11/13/13  4:55 PM      Result Value Ref Range   Lactic Acid, Venous 1.14  0.5 - 2.2 mmol/L  I-STAT CG4 LACTIC ACID, ED     Status: None   Collection Time    11/13/13  5:47 PM      Result Value Ref Range   Lactic Acid, Venous 1.20  0.5 - 2.2 mmol/L  URINALYSIS, ROUTINE W REFLEX MICROSCOPIC     Status: Abnormal   Collection Time    11/13/13  7:26 PM      Result Value Ref Range   Color, Urine YELLOW  YELLOW   APPearance CLEAR  CLEAR   Specific Gravity, Urine 1.012  1.005 - 1.030   pH 6.0  5.0 - 8.0   Glucose, UA NEGATIVE  NEGATIVE mg/dL   Hgb urine dipstick TRACE (*) NEGATIVE   Bilirubin Urine NEGATIVE  NEGATIVE   Ketones, ur 15 (*) NEGATIVE mg/dL   Protein, ur NEGATIVE  NEGATIVE mg/dL   Urobilinogen, UA 0.2  0.0 - 1.0 mg/dL   Nitrite NEGATIVE  NEGATIVE   Leukocytes, UA NEGATIVE  NEGATIVE  URINE MICROSCOPIC-ADD ON     Status: None   Collection Time    11/13/13  7:26 PM      Result Value Ref Range   Squamous Epithelial / LPF RARE  RARE   WBC, UA 0-2  <3 WBC/hpf   RBC / HPF 0-2  <3 RBC/hpf   Bacteria, UA RARE  RARE  TSH     Status: None   Collection Time    11/13/13  9:50 PM      Result Value Ref Range   TSH 2.880  0.350 - 4.500 uIU/mL  RAPID STREP SCREEN     Status: None   Collection Time    11/13/13 11:52 PM      Result  Value Ref Range   Streptococcus, Group A Screen (Direct) NEGATIVE  NEGATIVE   Comment: (NOTE)     A Rapid Antigen test may result negative if the antigen level in the     sample is below the detection level of this test. The FDA has not     cleared this test as a stand-alone test therefore the rapid antigen     negative result has reflexed to a Group A Strep culture.  COMPREHENSIVE METABOLIC PANEL     Status: Abnormal   Collection Time    11/14/13  4:10 AM      Result Value Ref Range   Sodium 140  137 - 147 mEq/L   Potassium 4.2  3.7 - 5.3 mEq/L   Chloride 107  96 - 112 mEq/L   CO2 23  19 - 32 mEq/L   Glucose, Bld 114 (*) 70 - 99 mg/dL   BUN 8  6 - 23 mg/dL   Creatinine, Ser 0.60  0.50 - 1.10 mg/dL   Calcium 7.3 (*) 8.4 - 10.5 mg/dL   Total Protein 5.5 (*) 6.0 - 8.3 g/dL   Albumin 2.9 (*) 3.5 - 5.2 g/dL   AST 18  0 - 37 U/L   ALT 14  0 - 35 U/L   Alkaline Phosphatase 62  39 - 117 U/L   Total Bilirubin 0.4  0.3 - 1.2 mg/dL   GFR calc non Af Amer >90  >90 mL/min   GFR calc Af Amer >90  >90 mL/min   Comment: (NOTE)     The eGFR has been calculated using the CKD EPI equation.     This calculation has not been validated in all clinical situations.     eGFR's persistently <90 mL/min signify possible Chronic Kidney     Disease.   Anion gap 10  5 - 15  CBC     Status: Abnormal   Collection Time    11/14/13  4:10 AM      Result Value Ref Range   WBC 14.6 (*) 4.0 - 10.5 K/uL   RBC 3.50 (*) 3.87 - 5.11 MIL/uL   Hemoglobin 10.4 (*) 12.0 - 15.0 g/dL   HCT 31.8 (*) 36.0 - 46.0 %   MCV 90.9  78.0 - 100.0 fL   MCH 29.7  26.0 - 34.0 pg   MCHC 32.7  30.0 - 36.0 g/dL   RDW 14.1  11.5 - 15.5 %   Platelets 268  150 - 400 K/uL  PROTIME-INR     Status: None   Collection Time    11/14/13  4:10 AM      Result Value Ref Range   Prothrombin Time 14.2  11.6 - 15.2 seconds   INR 1.10  0.00 - 1.49  SEDIMENTATION RATE     Status: None   Collection Time    11/14/13  4:10 AM      Result Value  Ref Range   Sed Rate 20  0 - 22 mm/hr    Dg Chest 2 View  11/14/2013   CLINICAL DATA:  Abnormal chest x-ray 11/13/2013. Cough. Fever. Elevated white blood cell count.  EXAM: CHEST  2 VIEW  COMPARISON:  Chest x-ray 11/13/2013.  FINDINGS: Lung volumes are low. No consolidative airspace disease. No pleural effusions. No pneumothorax. No pulmonary nodule or mass noted. Pulmonary vasculature and the cardiomediastinal silhouette are within normal limits. Atherosclerosis in the thoracic aorta. Severe S shaped thoracolumbar scoliosis convex to the right mid thoracic region and to the left at the thoracolumbar junction. Multiple surgical clips are again noted over the lateral aspect of the lower right hemithorax.  IMPRESSION: 1. Low lung volumes without radiographic evidence of acute cardiopulmonary disease. 2. Atherosclerosis.   Electronically Signed   By: Vinnie Langton M.D.   On: 11/14/2013 09:03   Dg Chest Port 1 View  11/13/2013   CLINICAL DATA:  Knee pain.  Tachycardia.  EXAM: PORTABLE CHEST - 1 VIEW  COMPARISON:  Chest radiograph 07/19/2003  FINDINGS: Stable cardiac and mediastinal contours. Minimal scarring and or atelectasis left lung base. No large consolidative pulmonary opacity. No pleural effusion or pneumothorax. Scoliotic curvature of the thoracolumbar spine.  IMPRESSION: Scarring and or atelectasis left lung base.   Electronically Signed   By: Lovey Newcomer M.D.   On: 11/13/2013 17:49   Dg Knee Right Port  11/13/2013   CLINICAL DATA:  53 year old female right knee pain. Medial pain. Initial encounter.  EXAM: PORTABLE RIGHT KNEE - 1-2 VIEW  COMPARISON:  None.  FINDINGS: Two portable views of the right knee. Bone mineralization is within normal limits. No joint effusion is evident. Medial greater than lateral compartment degenerative spurring. Patella intact. No fracture or dislocation identified.  IMPRESSION: No acute osseous abnormality identified at the right knee.   Electronically Signed   By:  Lars Pinks M.D.   On: 11/13/2013 19:46    Review of Systems  Constitutional: Positive for fever and malaise/fatigue. Negative for chills.       Fever on admission, afebrile now  HENT: Positive for congestion. Negative for sore throat.   Respiratory: Positive for cough. Negative for shortness of breath and wheezing.   Cardiovascular: Negative for chest pain and palpitations.  Gastrointestinal: Negative for nausea, vomiting and abdominal pain.  Genitourinary: Negative for dysuria.  Musculoskeletal: Positive for joint pain (Right knee) and myalgias.       Stiffness in neck, and hands and right knee  Skin: Negative for rash.  Neurological: Positive for weakness. Negative for tingling, sensory change and focal weakness.   Blood pressure 124/79, pulse 122, temperature 97.9 F (36.6 C), temperature source Oral, resp. rate 20, height _0  (1.448 m), weight 53.3 kg (117 lb 8.1 oz), SpO2 98.00%. Physical Exam  Constitutional: She appears well-developed and well-nourished. She is cooperative. No distress.  Initially sitting on the edge of the bed with her legs knees flexed beyond 90  Cardiovascular: Regular rhythm, S1 normal and S2 normal.  Tachycardia present.   Respiratory:  CTA B  GI:  Soft, NTND, + BS  Musculoskeletal:  Right Lower Extremity  Inspection:    Mild effusion at best   No rashes or wounds   Previous aspiration site noted to the superioanterior aspect of the right knee. Mild ecchymosis around the site as noted    Bony eval:   No specific tenderness over her distal femur, patella or proximal tibia and fibula   Hip and ankle unremarkable  Soft tissue:  No erythema is noted to the right knee whatsoever   No rashes   Right knee is stable with evaluation to varus and valgus stress at full extension and 30 of flexion   Stable with Lachman's and posterior drawer   No appreciable popping/crepitus with McMurray's test   No joint line tenderness   Diffuse and vague medial  tenderness  ROM:   Actively can range from full extension to 100 of flexion without significant pain   Passively can range from full extension to 110 of flexion without significant pain  Sensation:   DPN, SPN, TN and femoral nerve sensory functions intact  Motor:   EHL, FHL, anterior tibialis, posterior tibialis, peroneals and gastrocsoleus complex motor function intact. Quadriceps and hamstring motor function are grossly intact as well  Vascular:    2+ dorsalis pedis and posterior tibialis pulses are noted    Extremity is warm   Department soft and nontender   Neurological: She is alert.    Assessment/Plan:  52 year old white female admitted with fever/leukocytosis and right knee pain  1. Fever/leukocytosis  Per primary service  Suspect URI or bronchitis  2. right knee pain  Clinically I have no suspicion for septic arthritis as the patient is not behaving is warm with this particular condition as there is no erythema or significant pain with active and passive knee motion. Patient is able to complete a nearly full arc of knee range of motion  Patient has a very mild effusion  Suspect that the patient has some synovitis with arthritic flare and possibly small medial meniscal tear given the mechanism that precipitated her knee pain. ( Being seated in a figure 4 type position as well as pain going up and down stairs)  Patient does not describe any mechanical symptoms at this point, therefore do not think that an MRI is warranted. Her plain films demonstrate medial and lateral compartment arthritis.   Patient is agreeable to the aspiration although I do not believe that this will yield much.    Agree with additional labs for Lyme and RMSF, although suspect that this is likely viral with resultant arthralgias and myalgias  3. Disposition  Continue per primary service  Okay from an orthopedic standpoint to restart DVT prophylaxis if it is felt to be necessary by the primary  service  Jari Pigg, PA-C [facility] [phone] (P) 11/14/2013, 10:03 AM

## 2013-11-14 NOTE — Evaluation (Signed)
Physical Therapy Evaluation Patient Details Name: Victoria Hartman MRN: [id number] DOB: 1960/05/29 Today's Date: 11/14/2013   History of Present Illness  Pt adm with SIRS and rt knee pain.  Clinical Impression  Pt demonstrated good understanding of home ex program for rt knee pain. Recommended pt continue this program at home for several weeks. If knee pain persist would follow up with orthopedist concerning further PT. Advised pt she could continue her walking in neighborhood at home as she was doing prior to admission as long as it wasn't incr knee pain.     Follow Up Recommendations No PT follow up (if knee pain persists for several more weeks PT follow up per orthopedist.)    Equipment Recommendations  None recommended by PT    Recommendations for Other Services       Precautions / Restrictions Precautions Precautions: None      Mobility  Bed Mobility Overal bed mobility: Independent                Transfers Overall transfer level: Independent                  Ambulation/Gait Ambulation/Gait assistance: Independent Ambulation Distance (Feet): 50 Feet            Stairs            Wheelchair Mobility    Modified Rankin (Stroke Patients Only)       Balance Overall balance assessment: No apparent balance deficits (not formally assessed)                                           Pertinent Vitals/Pain Mild rt knee pain with initial heel slides.    Home Living Family/patient expects to be discharged to:: Private residence Living Arrangements: Spouse/significant other Available Help at Discharge: Family Type of Home: House       Home Layout: Two level        Prior Function Level of Independence: Independent               Hand Dominance        Extremity/Trunk Assessment   Upper Extremity Assessment: Overall WFL for tasks assessed           Lower Extremity Assessment: Overall WFL for tasks  assessed         Communication   Communication: No difficulties  Cognition Arousal/Alertness: Awake/alert Behavior During Therapy: WFL for tasks assessed/performed Overall Cognitive Status: Within Functional Limits for tasks assessed                      General Comments      Exercises General Exercises - Lower Extremity Quad Sets: Strengthening;Right;10 reps;Supine Short Arc Quad: Strengthening;Right;10 reps;Supine Long Arc Quad: Strengthening;Right;10 reps;Seated Heel Slides: Strengthening;Right;10 reps;Supine Straight Leg Raises: Strengthening;Right;10 reps;Supine      Assessment/Plan    PT Assessment Patent does not need any further PT services (if knee pain persists for several more weeks PT follow up per orthopedist.)  PT Diagnosis     PT Problem List    PT Treatment Interventions     PT Goals (Current goals can be found in the Care Plan section) Acute Rehab PT Goals PT Goal Formulation: No goals set, d/c therapy    Frequency     Barriers to discharge        Co-evaluation  End of Session   Activity Tolerance: Patient tolerated treatment well Patient left: in bed           Time: 1455-1517 PT Time Calculation (min): 22 min   Charges:   PT Evaluation $Initial PT Evaluation Tier I: 1 Procedure PT Treatments $Therapeutic Exercise: 8-22 mins   PT G Codes:          Cully Luckow 11/21/2013, 3:47 PM  [facility] PT [phone]

## 2013-11-15 LAB — URINE CULTURE: Colony Count: 50000

## 2013-11-15 LAB — BASIC METABOLIC PANEL
Anion gap: 11 (ref 5–15)
BUN: 5 mg/dL — ABNORMAL LOW (ref 6–23)
CO2: 25 mEq/L (ref 19–32)
Calcium: 7.7 mg/dL — ABNORMAL LOW (ref 8.4–10.5)
Chloride: 106 mEq/L (ref 96–112)
Creatinine, Ser: 0.48 mg/dL — ABNORMAL LOW (ref 0.50–1.10)
GFR calc Af Amer: >90 mL/min (ref >90)
GFR calc non Af Amer: >90 mL/min (ref >90)
Glucose, Bld: 119 mg/dL — ABNORMAL HIGH (ref 70–99)
Potassium: 3.3 mEq/L — ABNORMAL LOW (ref 3.7–5.3)
Sodium: 142 mEq/L (ref 137–147)

## 2013-11-15 LAB — ROCKY MTN SPOTTED FVR AB, IGM-BLOOD: RMSF IgM: 0.13 IV (ref 0.00–0.89)

## 2013-11-15 LAB — B. BURGDORFI ANTIBODIES: B burgdorferi Ab IgG+IgM: 0.28 {ISR}

## 2013-11-15 LAB — CULTURE, GROUP A STREP

## 2013-11-15 MED ORDER — AZITHROMYCIN 500 MG PO TABS: 500.0000 mg | ORAL_TABLET | Freq: Every day | ORAL | Status: AC

## 2013-11-15 MED ORDER — AZITHROMYCIN 500 MG PO TABS
500.0000 mg | ORAL_TABLET | Freq: Every day | ORAL | Status: DC
  Administered 2013-11-15: 500 mg via ORAL
  Filled 2013-11-15: qty 1

## 2013-11-15 NOTE — Progress Notes (Signed)
Assessment unchanged. Discussed D/C instructions with pt including f/u appointments and new medications. Verbalized understanding. RX given to pt. IV and tele removed.  

## 2013-11-15 NOTE — Discharge Summary (Addendum)
Physician Discharge Summary  Victoria Hartman:[id number] DOB: 10-Aug-1960 DOA: 11/13/2013  PCP: Pcp Not In System  Admit date: 11/13/2013 Discharge date: 11/15/2013  Time spent: 50 minutes  Recommendations for Outpatient Follow-up:  1. Recommend followup orthopedics Dr. Marcelino Scot as outpatient  2. Recommend completion of 3 days azithromycin 500 mg for bronchitis/sinusitis 3. Consider HbA1c as an outpatient   Discharge Diagnoses:  Principal Problem:   SIRS (systemic inflammatory response syndrome) Active Problems:   Breast cancer   Right knee pain   Sinusitis   Discharge Condition: Good  Diet recommendation: Regular  Filed Weights   11/13/13 2246 11/14/13 0551 11/15/13 0500  Weight: 53.842 kg (118 lb 11.2 oz) 53.3 kg (117 lb 8.1 oz) 53 kg (116 lb 13.5 oz)    History of present illness:  52/F with prior h/o breast CA, admitted 11/13/13 with evidences of early SIRS, fever,   she stated that she was having also upper respiratory symptoms the past month, not treated with antibiotic and thought to be viral    she also had past 2 weeks has been walking 1.6 mass in developed knee pain around the same time as admission prompting orthopedic input   Hospital Course:  1. Fever/SIRS -clinically thought to be either sinusitis or bronchitis-patient received 2 days of IV doxycycline/ceftriaxone for presumed RMSF/Lyme -given concern of R knee infection d/w dr.Handy-patient noted to have full range of motion to the knee on this exam and able to ambulate therefore patient given steroid injection which greatly relieved her pain. No fluid was aspirated and it was thought that patient had maybe a synovitis.  Very unlikely to be septic joint.  MRI was deferred by them and she should followup in the outpatient setting with Dr. hand  -FU CXR Done 7/21 was negative - negative for Lyme and RMSF titres 2. R knee pain and swelling  -suspect related to arthralgia and myalgias from 1  3. H/o Breast CA   Outpatient follow    Procedures: Knee arthrocentesis 7/21    Consultations:  Orthopedics   Discharge Exam: Filed Vitals:   11/15/13 0500  BP: 137/85  Pulse: 94  Temp: 98.8 F (37.1 C)  Resp: 18    General: Alert pleasant oriented not in distress  Cardiovascular: S1-S2 no murmur rub or gallop  Respiratory: Clinically clear full range of motion. Extremities no redness or warmth over the knee    Discharge Instructions You were cared for by a hospitalist during your hospital stay. If you have any questions about your discharge medications or the care you received while you were in the hospital after you are discharged, you can call the unit and asked to speak with the hospitalist on call if the hospitalist that took care of you is not available. Once you are discharged, your primary care physician will handle any further medical issues. Please note that NO REFILLS for any discharge medications will be authorized once you are discharged, as it is imperative that you return to your primary care physician (or establish a relationship with a primary care physician if you do not have one) for your aftercare needs so that they can reassess your need for medications and monitor your lab values.     Medication List         azithromycin 500 MG tablet  Commonly known as:  ZITHROMAX  Take 1 tablet (500 mg total) by mouth daily.     calcium citrate-vitamin D 315-200 MG-UNIT per tablet  Commonly known as:  CITRACAL+D  Take 1 tablet by mouth 2 (two) times daily.     Vitamin D-3 1000 UNITS Caps  Take 1 each by mouth daily.       No Known Allergies    The results of significant diagnostics from this hospitalization (including imaging, microbiology, ancillary and laboratory) are listed below for reference.    Significant Diagnostic Studies: Dg Chest 2 View  11/14/2013   CLINICAL DATA:  Abnormal chest x-ray 11/13/2013. Cough. Fever. Elevated white blood cell count.  EXAM: CHEST  2  VIEW  COMPARISON:  Chest x-ray 11/13/2013.  FINDINGS: Lung volumes are low. No consolidative airspace disease. No pleural effusions. No pneumothorax. No pulmonary nodule or mass noted. Pulmonary vasculature and the cardiomediastinal silhouette are within normal limits. Atherosclerosis in the thoracic aorta. Severe S shaped thoracolumbar scoliosis convex to the right mid thoracic region and to the left at the thoracolumbar junction. Multiple surgical clips are again noted over the lateral aspect of the lower right hemithorax.  IMPRESSION: 1. Low lung volumes without radiographic evidence of acute cardiopulmonary disease. 2. Atherosclerosis.   Electronically Signed   By: Vinnie Langton M.D.   On: 11/14/2013 09:03   Dg Chest Port 1 View  11/13/2013   CLINICAL DATA:  Knee pain.  Tachycardia.  EXAM: PORTABLE CHEST - 1 VIEW  COMPARISON:  Chest radiograph 07/19/2003  FINDINGS: Stable cardiac and mediastinal contours. Minimal scarring and or atelectasis left lung base. No large consolidative pulmonary opacity. No pleural effusion or pneumothorax. Scoliotic curvature of the thoracolumbar spine.  IMPRESSION: Scarring and or atelectasis left lung base.   Electronically Signed   By: Lovey Newcomer M.D.   On: 11/13/2013 17:49   Dg Knee Right Port  11/13/2013   CLINICAL DATA:  53 year old female right knee pain. Medial pain. Initial encounter.  EXAM: PORTABLE RIGHT KNEE - 1-2 VIEW  COMPARISON:  None.  FINDINGS: Two portable views of the right knee. Bone mineralization is within normal limits. No joint effusion is evident. Medial greater than lateral compartment degenerative spurring. Patella intact. No fracture or dislocation identified.  IMPRESSION: No acute osseous abnormality identified at the right knee.   Electronically Signed   By: Lars Pinks M.D.   On: 11/13/2013 19:46    Microbiology: Recent Results (from the past 240 hour(s))  CULTURE, BLOOD (ROUTINE X 2)     Status: None   Collection Time    11/13/13  5:34 PM       Result Value Ref Range Status   Specimen Description BLOOD ARM LEFT   Final   Special Requests BOTTLES DRAWN AEROBIC AND ANAEROBIC [facility]   Final   Culture  Setup Time     Final   Value: 11/14/2013 00:25     Performed at [facility]   Culture     Final   Value:        BLOOD CULTURE RECEIVED NO GROWTH TO DATE CULTURE WILL BE HELD FOR 5 DAYS BEFORE ISSUING A FINAL NEGATIVE REPORT     Performed at [facility]   Report Status PENDING   Incomplete  CULTURE, BLOOD (ROUTINE X 2)     Status: None   Collection Time    11/13/13  5:38 PM      Result Value Ref Range Status   Specimen Description BLOOD HAND LEFT   Final   Special Requests BOTTLES DRAWN AEROBIC ONLY 5CC   Final   Culture  Setup Time     Final   Value:  11/14/2013 00:24     Performed at [facility]   Culture     Final   Value:        BLOOD CULTURE RECEIVED NO GROWTH TO DATE CULTURE WILL BE HELD FOR 5 DAYS BEFORE ISSUING A FINAL NEGATIVE REPORT     Performed at [facility]   Report Status PENDING   Incomplete  URINE CULTURE     Status: None   Collection Time    11/13/13  7:27 PM      Result Value Ref Range Status   Specimen Description URINE, CLEAN CATCH   Final   Special Requests NONE   Final   Culture  Setup Time     Final   Value: 11/14/2013 00:40     Performed at [facility]     Final   Value: 50,000 COLONIES/ML     Performed at [facility]   Culture     Final   Value: Multiple bacterial morphotypes present, none predominant. Suggest appropriate recollection if clinically indicated.     Performed at [facility]   Report Status 11/15/2013 FINAL   Final  RAPID STREP SCREEN     Status: None   Collection Time    11/13/13 11:52 PM      Result Value Ref Range Status   Streptococcus, Group A Screen (Direct) NEGATIVE  NEGATIVE Final   Comment: (NOTE)     A Rapid Antigen test may result negative if the antigen level in the     sample is below the  detection level of this test. The FDA has not     cleared this test as a stand-alone test therefore the rapid antigen     negative result has reflexed to a Group A Strep culture.  CULTURE, GROUP A STREP     Status: None   Collection Time    11/13/13 11:52 PM      Result Value Ref Range Status   Specimen Description THROAT   Final   Special Requests NONE   Final   Culture     Final   Value: NO SUSPICIOUS COLONIES, CONTINUING TO HOLD     Performed at [facility]   Report Status PENDING   Incomplete     Labs: Basic Metabolic Panel:  Recent Labs Lab 11/13/13 1645 11/14/13 0410 11/15/13 1105  NA 137 140 142  K 3.7 4.2 3.3*  CL 100 107 106  CO2 25 23 25   GLUCOSE 124* 114* 119*  BUN 17 8 5*  CREATININE 0.81 0.60 0.48*  CALCIUM 9.1 7.3* 7.7*   Liver Function Tests:  Recent Labs Lab 11/14/13 0410  AST 18  ALT 14  ALKPHOS 62  BILITOT 0.4  PROT 5.5*  ALBUMIN 2.9*   No results found for this basename: LIPASE, AMYLASE,  in the last 168 hours No results found for this basename: AMMONIA,  in the last 168 hours CBC:  Recent Labs Lab 11/13/13 1645 11/14/13 0410  WBC 17.4* 14.6*  HGB 12.1 10.4*  HCT 36.7 31.8*  MCV 88.6 90.9  PLT 323 268   Cardiac Enzymes: No results found for this basename: CKTOTAL, CKMB, CKMBINDEX, TROPONINI,  in the last 168 hours BNP: BNP (last 3 results) No results found for this basename: PROBNP,  in the last 8760 hours CBG: No results found for this basename: GLUCAP,  in the last 168 hours     Signed:  Nita Sells  [facility] 11/15/2013, 12:45 PM

## 2013-11-20 LAB — CULTURE, BLOOD (ROUTINE X 2)
Culture: NO GROWTH
Culture: NO GROWTH

## 2013-12-05 NOTE — Procedures (Signed)
Aspiration consistent with acute synovitis. No evidence of sepsis. Will follow clinical examination and function.  Repeat if symptoms and examination warrant.  Altamese Montvale, MD [facility] [phone] (p)

## 2013-12-05 NOTE — Consult Note (Signed)
I have reviewed and discussed in detail with Mr. Paul the patient's presentation, examination findings, and I formulated the plan outlined above.  Cheyenne Schumm, MD [facility] [phone] (p)   

## 2014-01-17 ENCOUNTER — Other Ambulatory Visit (HOSPITAL_COMMUNITY)
Admission: RE | Admit: 2014-01-17 | Discharge: 2014-01-17 | Disposition: A | Discharge status: Home / Self Care | Payer: PRIVATE HEALTH INSURANCE | Source: Ambulatory Visit | Attending: Family Medicine | Admitting: Family Medicine

## 2014-01-17 ENCOUNTER — Other Ambulatory Visit: Payer: Self-pay | Admitting: Family Medicine

## 2014-01-17 DIAGNOSIS — Z1151 Encounter for screening for human papillomavirus (HPV): Secondary | ICD-10-CM | POA: Diagnosis present

## 2014-01-17 DIAGNOSIS — Z124 Encounter for screening for malignant neoplasm of cervix: Secondary | ICD-10-CM | POA: Diagnosis not present

## 2014-01-22 LAB — CYTOLOGY - PAP

## 2017-02-03 ENCOUNTER — Other Ambulatory Visit (HOSPITAL_COMMUNITY)
Admission: RE | Admit: 2017-02-03 | Discharge: 2017-02-03 | Disposition: A | Discharge status: Home / Self Care | Payer: PRIVATE HEALTH INSURANCE | Source: Ambulatory Visit | Attending: Family Medicine | Admitting: Family Medicine

## 2017-02-03 ENCOUNTER — Other Ambulatory Visit: Payer: Self-pay | Admitting: Family Medicine

## 2017-02-03 DIAGNOSIS — Z124 Encounter for screening for malignant neoplasm of cervix: Secondary | ICD-10-CM | POA: Diagnosis not present

## 2017-02-05 LAB — CYTOLOGY - PAP
Diagnosis: NEGATIVE
HPV: NOT DETECTED

## 2017-12-07 ENCOUNTER — Ambulatory Visit: Payer: Managed Care, Other (non HMO) | Attending: Family Medicine

## 2017-12-07 ENCOUNTER — Other Ambulatory Visit: Payer: Self-pay

## 2017-12-07 DIAGNOSIS — M25652 Stiffness of left hip, not elsewhere classified: Secondary | ICD-10-CM | POA: Diagnosis present

## 2017-12-07 DIAGNOSIS — R252 Cramp and spasm: Secondary | ICD-10-CM | POA: Diagnosis present

## 2017-12-07 DIAGNOSIS — M6281 Muscle weakness (generalized): Secondary | ICD-10-CM | POA: Diagnosis present

## 2017-12-07 DIAGNOSIS — M5441 Lumbago with sciatica, right side: Secondary | ICD-10-CM | POA: Insufficient documentation

## 2017-12-07 DIAGNOSIS — M25651 Stiffness of right hip, not elsewhere classified: Secondary | ICD-10-CM

## 2017-12-07 DIAGNOSIS — G8929 Other chronic pain: Secondary | ICD-10-CM | POA: Insufficient documentation

## 2017-12-07 NOTE — Therapy (Signed)
[facility] [address], [place], [place], [address] Phone: [phone]   Fax:  [phone]  Physical Therapy Evaluation  Patient Details  Name: Victoria Hartman MRN: [id number] Date of Birth: 30-Dec-1960 Referring Provider: Milagros Evener, MD   Encounter Date: 12/07/2017  PT End of Session - 12/07/17 0929    Visit Number  1    Date for PT Re-Evaluation  02/01/18    Authorization Type  [employer]    PT Start Time  [phone]    PT Stop Time  0931    PT Time Calculation (min)  53 min    Activity Tolerance  Patient tolerated treatment well    Behavior During Therapy  [facility] for tasks assessed/performed       Past Medical History:  Diagnosis Date  . Cancer (HCC)    breast  . Dysplastic nevus syndrome   . Medical history non-contributory   . Osteopenia   . Yeast infection     Past Surgical History:  Procedure Laterality Date  . BREAST LUMPECTOMY    . GYNECOLOGIC CRYOSURGERY    . MOLE REMOVAL    . WISDOM TOOTH EXTRACTION      There were no vitals filed for this visit.   Subjective Assessment - 12/07/17 0837    Subjective  Pt presents to PT with complaints of LBP and Rt LE pain that has been increasing over the past 6 months.  Pt just completed a 12 day dose of Prednisone.  While on the Prednisone, the pain was significantly better.      Pertinent History  history of breast cancer (2005), osteopenia    How long can you stand comfortably?  pain and fatigue but not limited    How long can you walk comfortably?  pain and fatigue but not limited    Diagnostic tests  none    Patient Stated Goals  exercise without pain, reduce Rt LE pain, reduce LBP    Currently in Pain?  Yes    Pain Score  3    3-5/10   Pain Location  Back    Pain Orientation  Right;Left;Lower    Pain Descriptors / Indicators  Aching;Tightness;Sore    Pain Type  Chronic pain    Pain Radiating Towards  Rt LE posteriorly    Pain Onset  More than a month  ago    Pain Frequency  Intermittent    Aggravating Factors   negotiating steps, sitting too long (stiff), standing and walking, in bed    Pain Relieving Factors  Aleve, change of position         [facility] PT Assessment - 12/07/17 0001      Assessment   Medical Diagnosis  acute bilateral low back pain    Referring Provider  Milagros Evener, MD    Onset Date/Surgical Date  08/07/17   chronic   Next MD Visit  none    Prior Therapy  none      Precautions   Precautions  Other (comment)   history of breast cancer     Restrictions   Weight Bearing Restrictions  No      Balance Screen   Has the patient fallen in the past 6 months  No    Has the patient had a decrease in activity level because of a fear of falling?   No    Is the patient reluctant to leave their home because of a fear of falling?   No  Home Environment   Living Environment  Private residence    Living Arrangements  Spouse/significant other    Home Access  Stairs to enter    Home Layout  Two level      Prior Function   Level of Independence  Independent    Vocation  Full time employment    Vocation Requirements  desk work    Leisure  aerobic exercise to music at home      Cognition   Overall Cognitive Status  Within Functional Limits for tasks assessed      Observation/Other Assessments   Focus on Therapeutic Outcomes (FOTO)   45% limitation      Posture/Postural Control   Posture/Postural Control  Postural limitations    Postural Limitations  Increased thoracic kyphosis;Weight shift right    Posture Comments  scoliosis with Lt thoracic convexity, Rt lumbar convexity.  Hypertrophy over Lt thoracic musculature      ROM / Strength   AROM / PROM / Strength  AROM;PROM;Strength      AROM   Overall AROM   Deficits    Overall AROM Comments  Lumbar A/ROM is limited by 25% into Lt sidebending, all other lumbar A/ROM is full without pain.  Hip flexibility limited by 25% in hamstring stretch and IR      PROM    Overall PROM   Deficits    Overall PROM Comments  SLR limited by 25% bilaterally and IR limited by 25% with pain on the Rt      Strength   Overall Strength  Within functional limits for tasks performed    Overall Strength Comments  5/5 Lt LE and 4+/5 Rt hip, 5/5 Rt knee      Palpation   Spinal mobility  reduced spinal mobilityby 50% T5-L5 without pain    Palpation comment  palpable tenderness over Rt proximal gluteals, no pain but tension noted over bil lumbar paraspinals and increased tone over Lt thoracic and lumbar musculature secondary to scoliosis.      Special Tests    Special Tests  Lumbar    Lumbar Tests  Slump Test      Slump test   Findings  Negative    Side  Right      Ambulation/Gait   Gait Pattern  Within Functional Limits                Objective measurements completed on examination: See above findings.              PT Education - 12/07/17 0928    Education Details   Access Code: [email or web address]     Person(s) Educated  Patient    Methods  Explanation;Demonstration;Handout    Comprehension  Verbalized understanding;Returned demonstration       PT Short Term Goals - 12/07/17 0947      PT SHORT TERM GOAL #1   Title  be independent in initial HEP    Time  4    Period  Weeks    Status  New    Target Date  01/04/18      PT SHORT TERM GOAL #2   Title  verbalize and demonstrate understanding of body mechanics and posture modifications for work and home tasks    Time  4    Period  Weeks    Status  New    Target Date  01/04/18      PT SHORT TERM GOAL #3   Title  report a 25% reduction in LBP  with standing and negotiating steps    Time  4    Period  Weeks    Status  New    Target Date  01/04/18        PT Long Term Goals - 12/07/17 1034      PT LONG TERM GOAL #1   Title  be independent in advanced HEP    Time  8    Period  Weeks    Status  New    Target Date  02/01/18      PT LONG TERM GOAL #2   Title  reduce FOTO to < or = to  31% limitation    Time  8    Period  Weeks    Status  New    Target Date  02/01/18      PT LONG TERM GOAL #3   Title  demonstrate neutral seated posture > or = to 50% of the time    Time  8    Period  Weeks    Target Date  02/01/18      PT LONG TERM GOAL #4   Title  report a 60% reduction in LBP and Rt LE pain with standing and negotiating steps    Time  8    Period  Weeks    Status  New    Target Date  02/01/18      PT LONG TERM GOAL #5   Title  return to regular exercise routine with modifications to reduce impact and avoid lumbar strain    Time  8    Period  Weeks    Status  New    Target Date  02/01/18             Plan - 12/07/17 1016    Clinical Impression Statement  Pt presents to PT with complaints of > 6 month history of LBP and Rt LE radiculopathy.  No imaging has been done.  Pt exercises regularly by doing aerobics/dance to music and feels like she might be doing exercise that is too high impact.  Pt has significant scoliosis with Rt lumbar and Lt thoracic convexity, leans Rt in sitting and standing and has Lt thoracic hypertrophy.  Pt with limited hip flexibility Rt>Lt and strength deficits in the Rt hip vs Lt.  Pt with palpable tenderness over Rt gluteals and tension without pain over bil lumbar paraspinals.  Pt will benefit from skilled PT for manual/dry needling, posture and body mechanics education, flexibility and core/postural strength.      History and Personal Factors relevant to plan of care:  scoliosis, history of breast cancer    Clinical Presentation  Stable    Clinical Presentation due to:  chronic pain x 6 months    Clinical Decision Making  Low    Rehab Potential  Good    PT Frequency  2x / week    PT Duration  8 weeks    PT Treatment/Interventions  ADLs/Self Care Home Management;Electrical Stimulation;Moist Heat;Functional mobility training;Therapeutic exercise;Therapeutic activities;Patient/family education;Neuromuscular re-education;Manual  techniques;Passive range of motion;Taping;Dry needling    PT Next Visit Plan  dry needling to Rt gluteals and lumbar multifidi, sidebending over roll to address scoliosis, review HEP, body mechanics education    PT Home Exercise Plan  Access Code: [email or web address]    Consulted and Agree with Plan of Care  Patient       Patient will benefit from skilled therapeutic intervention in order to improve the following deficits and impairments:  Impaired flexibility, Decreased activity tolerance, Decreased strength, Decreased endurance, Postural dysfunction, Increased muscle spasms, Hypomobility, Improper body mechanics, Pain, Abnormal gait  Visit Diagnosis: Chronic bilateral low back pain with right-sided sciatica - Plan: PT plan of care cert/re-cert  Muscle weakness (generalized) - Plan: PT plan of care cert/re-cert  Stiffness of left hip, not elsewhere classified - Plan: PT plan of care cert/re-cert  Stiffness of right hip, not elsewhere classified - Plan: PT plan of care cert/re-cert  Cramp and spasm - Plan: PT plan of care cert/re-cert     Problem List Patient Active Problem List   Diagnosis Date Noted  . Sepsis ([place]) 11/13/2013  . SIRS (systemic inflammatory response syndrome) ([place]) 11/13/2013  . Right knee pain 11/13/2013  . Sinusitis 11/13/2013  . Osteopenia   . Breast cancer ([place]) 01/26/2004    Sigurd Sos, PT 12/07/17 10:59 AM  [facility] [address] [place], [place], [address] Phone: [phone]   Fax:  [phone]  Name: Victoria Hartman MRN: [id number] Date of Birth: 09-07-1960

## 2017-12-07 NOTE — Patient Instructions (Addendum)
Cervico-Thoracic: Extension / Rotation (Sitting)    Reach across body with left arm and grasp back of chair. Gently look over right side shoulder. Hold _20___ seconds. Relax. Repeat _3___ times per set. Do __1_ sets per session. Do __3__ sessions per day.  [email or web address]   Access Code: [email or web address]  URL: [email or web address]  Date: 12/07/2017  Prepared by: Sigurd Sos   Exercises  Supine Lower Trunk Rotation - 3 reps - 1 sets - 20 hold - 1x daily - 7x weekly  Hooklying Single Knee to Chest - 3 reps - 1 sets - 20 hold - 3x daily - 7x weekly  Seated Hamstring Stretch - 3 reps - 1 sets - 20 hold - 3x daily - 7x weekly  Seated Piriformis Stretch - 3 reps - 1 sets - 20 hold - 3x daily - 7x weekly     [facility] [address], [place], [place] [address] Phone # [phone] Fax [phone]

## 2017-12-09 ENCOUNTER — Encounter: Payer: Self-pay | Admitting: Physical Therapy

## 2017-12-09 ENCOUNTER — Ambulatory Visit: Payer: Managed Care, Other (non HMO) | Admitting: Physical Therapy

## 2017-12-09 DIAGNOSIS — M5441 Lumbago with sciatica, right side: Secondary | ICD-10-CM | POA: Diagnosis not present

## 2017-12-09 DIAGNOSIS — M25652 Stiffness of left hip, not elsewhere classified: Secondary | ICD-10-CM

## 2017-12-09 DIAGNOSIS — G8929 Other chronic pain: Secondary | ICD-10-CM

## 2017-12-09 DIAGNOSIS — M6281 Muscle weakness (generalized): Secondary | ICD-10-CM

## 2017-12-09 DIAGNOSIS — R252 Cramp and spasm: Secondary | ICD-10-CM

## 2017-12-09 DIAGNOSIS — M25651 Stiffness of right hip, not elsewhere classified: Secondary | ICD-10-CM

## 2017-12-09 NOTE — Patient Instructions (Signed)
    Trigger Point Dry Needling  . What is Trigger Point Dry Needling (DN)? o DN is a physical therapy technique used to treat muscle pain and dysfunction. Specifically, DN helps deactivate muscle trigger points (muscle knots).  o A thin filiform needle is used to penetrate the skin and stimulate the underlying trigger point. The goal is for a local twitch response (LTR) to occur and for the trigger point to relax. No medication of any kind is injected during the procedure.   . What Does Trigger Point Dry Needling Feel Like?  o The procedure feels different for each individual patient. Some patients report that they do not actually feel the needle enter the skin and overall the process is not painful. Very mild bleeding may occur. However, many patients feel a deep cramping in the muscle in which the needle was inserted. This is the local twitch response.   Marland Kitchen How Will I feel after the treatment? o Soreness is normal, and the onset of soreness may not occur for a few hours. Typically this soreness does not last longer than two days.  o Bruising is uncommon, however; ice can be used to decrease any possible bruising.  o In rare cases feeling tired or nauseous after the treatment is normal. In addition, your symptoms may get worse before they get better, this period will typically not last longer than 24 hours.   . What Can I do After My Treatment? o Increase your hydration by drinking more water for the next 24 hours. o You may place ice or heat on the areas treated that have become sore, however, do not use heat on inflamed or bruised areas. Heat often brings more relief post needling. o You can continue your regular activities, but vigorous activity is not recommended initially after the treatment for 24 hours. o DN is best combined with other physical therapy such as strengthening, stretching, and other therapies.      Access Code: [email or web address]  URL: [email or web address]  Date:  12/09/2017  Prepared by: Ruben Im   Exercises  Supine Lower Trunk Rotation - 3 reps - 1 sets - 20 hold - 1x daily - 7x weekly  Hooklying Single Knee to Chest - 3 reps - 1 sets - 20 hold - 3x daily - 7x weekly  Seated Hamstring Stretch - 3 reps - 1 sets - 20 hold - 3x daily - 7x weekly  Seated Piriformis Stretch - 3 reps - 1 sets - 20 hold - 3x daily - 7x weekly  Supine Piriformis Stretch - 3 reps - 1 sets - 20 hold - 1x daily - 7x weekly  Supine Piriformis Stretch - 3 reps - 1 sets - 20 hold - 1x daily - 7x weekly  Clamshell - 10 reps - 1 sets - 1x daily - 7x weekly       [facility] [address] [place], [place] [address] Phone # [phone] Fax [phone]

## 2017-12-09 NOTE — Therapy (Signed)
[facility] [address] [place], [place], [address] Phone: [phone]   Fax:  [phone]  Physical Therapy Treatment  Patient Details  Name: Victoria Hartman MRN: [id number] Date of Birth: 08-Aug-1960 Referring Provider: Milagros Evener, MD   Encounter Date: 12/09/2017  PT End of Session - 12/09/17 1520    Visit Number  2    Date for PT Re-Evaluation  02/01/18    Authorization Type  [employer]    PT Start Time  1400    PT Stop Time  1450    PT Time Calculation (min)  50 min    Activity Tolerance  Patient tolerated treatment well       Past Medical History:  Diagnosis Date  . Cancer (HCC)    breast  . Dysplastic nevus syndrome   . Medical history non-contributory   . Osteopenia   . Yeast infection     Past Surgical History:  Procedure Laterality Date  . BREAST LUMPECTOMY    . GYNECOLOGIC CRYOSURGERY    . MOLE REMOVAL    . WISDOM TOOTH EXTRACTION      There were no vitals filed for this visit.  Subjective Assessment - 12/09/17 1405    Subjective  I have some questions about what we talked about last time.  I'm a tick better than I was on Tuesday.  I'm doing better with [facility] stretch.  Initially seated piriformis was really painful but a little better now.  I'm getting a standing desk and it's already been delivered.    (Pended)     Pertinent History  history of breast cancer (2005), osteopenia  (Pended)     Patient Stated Goals  exercise without pain, reduce Rt LE pain, reduce LBP  (Pended)     Currently in Pain?  Yes  (Pended)     Pain Score  2   (Pended)     Pain Orientation  Right  (Pended)     Pain Type  Chronic pain  (Pended)                        [facility] Adult PT Treatment/Exercise - 12/09/17 0001      Lumbar Exercises: Stretches   Single Knee to Chest Stretch  Right;Left;3 reps;20 seconds    Piriformis Stretch  Right;Left;3 reps;20 seconds    Piriformis Stretch Limitations   seated and supine     Other Lumbar Stretch Exercise  review of initial HEP    Other Lumbar Stretch Exercise  discussion of evaluation findings regarding muscular  vs. nerve pain       Lumbar Exercises: Sidelying   Clam  Right;10 reps      Moist Heat Therapy   Number Minutes Moist Heat  5 Minutes   while discussing HEP   Moist Heat Location  Lumbar Spine;Hip      Manual Therapy   Manual Therapy  Soft tissue mobilization    Soft tissue mobilization  bil lumbar paraspinals, right piriformis, right gluteals       Trigger Point Dry Needling - 12/09/17 1520    Consent Given?  Yes    Education Handout Provided  Yes    Muscles Treated Lower Body  Gluteus minimus;Gluteus maximus;Piriformis   bil lumbar multifidi    Gluteus Maximus Response  Twitch response elicited;Palpable increased muscle length    Gluteus Minimus Response  Twitch response elicited;Palpable increased muscle length    Piriformis Response  Twitch response elicited;Palpable  increased muscle length   supine and sidelying          PT Education - 12/09/17 1443    Education Details   Access Code: [email or web address] supine piriformis stretches;  sidelying clams;  DN info    Person(s) Educated  Patient    Methods  Explanation;Demonstration;Handout    Comprehension  Verbalized understanding;Returned demonstration       PT Short Term Goals - 12/07/17 0947      PT SHORT TERM GOAL #1   Title  be independent in initial HEP    Time  4    Period  Weeks    Status  New    Target Date  01/04/18      PT SHORT TERM GOAL #2   Title  verbalize and demonstrate understanding of body mechanics and posture modifications for work and home tasks    Time  4    Period  Weeks    Status  New    Target Date  01/04/18      PT SHORT TERM GOAL #3   Title  report a 25% reduction in LBP with standing and negotiating steps    Time  4    Period  Weeks    Status  New    Target Date  01/04/18        PT Long Term Goals - 12/07/17 1034       PT LONG TERM GOAL #1   Title  be independent in advanced HEP    Time  8    Period  Weeks    Status  New    Target Date  02/01/18      PT LONG TERM GOAL #2   Title  reduce FOTO to < or = to 31% limitation    Time  8    Period  Weeks    Status  New    Target Date  02/01/18      PT LONG TERM GOAL #3   Title  demonstrate neutral seated posture > or = to 50% of the time    Time  8    Period  Weeks    Target Date  02/01/18      PT LONG TERM GOAL #4   Title  report a 60% reduction in LBP and Rt LE pain with standing and negotiating steps    Time  8    Period  Weeks    Status  New    Target Date  02/01/18      PT LONG TERM GOAL #5   Title  return to regular exercise routine with modifications to reduce impact and avoid lumbar strain    Time  8    Period  Weeks    Status  New    Target Date  02/01/18            Plan - 12/09/17 1715    Clinical Impression Statement  The patient is receptive to the addition of DN and manual therapy as an adjunct to stretching of lumbar and hip regions.  Following treatment she has improved soft tissue mobility and decreased tender point size and number.  Added gluteal strengthening to HEP as well as supine piriformis exercises as patient has excessive rounding of thoracic spine in sitting when stretching her piriformis on right.  Therapist closely monitoring response with all treatment interventions.      Rehab Potential  Good    PT Frequency  2x / week  PT Duration  8 weeks    PT Treatment/Interventions  ADLs/Self Care Home Management;Electrical Stimulation;Moist Heat;Functional mobility training;Therapeutic exercise;Therapeutic activities;Patient/family education;Neuromuscular re-education;Manual techniques;Passive range of motion;Taping;Dry needling    PT Next Visit Plan  assess response to dry needling #1 to Rt gluteals and lumbar multifidi, sidebending over roll to address scoliosis, review HEP, body mechanics education;  right gluteal  strengthening; manual therapy     PT Home Exercise Plan  Access Code: [place]       Patient will benefit from skilled therapeutic intervention in order to improve the following deficits and impairments:  Impaired flexibility, Decreased activity tolerance, Decreased strength, Decreased endurance, Postural dysfunction, Increased muscle spasms, Hypomobility, Improper body mechanics, Pain, Abnormal gait  Visit Diagnosis: Chronic bilateral low back pain with right-sided sciatica  Muscle weakness (generalized)  Stiffness of left hip, not elsewhere classified  Stiffness of right hip, not elsewhere classified  Cramp and spasm     Problem List Patient Active Problem List   Diagnosis Date Noted  . Sepsis ([place]) 11/13/2013  . SIRS (systemic inflammatory response syndrome) ([place]) 11/13/2013  . Right knee pain 11/13/2013  . Sinusitis 11/13/2013  . Osteopenia   . Breast cancer ([place]) 01/26/2004   Ruben Im, PT 12/09/17 5:23 PM Phone: [phone] Fax: [phone]  Alvera Singh 12/09/2017, 5:23 PM  [facility] [address] [place], [place], [address] Phone: [phone]   Fax:  [phone]  Name: FALLAN MCCAREY MRN: [id number] Date of Birth: 1961-03-20

## 2017-12-15 ENCOUNTER — Encounter

## 2017-12-16 ENCOUNTER — Ambulatory Visit: Payer: Managed Care, Other (non HMO)

## 2017-12-16 DIAGNOSIS — M25651 Stiffness of right hip, not elsewhere classified: Secondary | ICD-10-CM

## 2017-12-16 DIAGNOSIS — M25652 Stiffness of left hip, not elsewhere classified: Secondary | ICD-10-CM

## 2017-12-16 DIAGNOSIS — G8929 Other chronic pain: Secondary | ICD-10-CM

## 2017-12-16 DIAGNOSIS — R252 Cramp and spasm: Secondary | ICD-10-CM

## 2017-12-16 DIAGNOSIS — M5441 Lumbago with sciatica, right side: Secondary | ICD-10-CM | POA: Diagnosis not present

## 2017-12-16 DIAGNOSIS — M6281 Muscle weakness (generalized): Secondary | ICD-10-CM

## 2017-12-16 NOTE — Therapy (Signed)
[facility] [address], [place], [place], [address] Phone: [phone]   Fax:  [phone]  Physical Therapy Treatment  Patient Details  Name: Victoria Hartman MRN: [id number] Date of Birth: [birth year] Referring Provider: Milagros Evener, MD   Encounter Date: 12/16/2017  PT End of Session - 12/16/17 0924    Visit Number  3    Date for PT Re-Evaluation  02/01/18    Authorization Type  [employer]ign[employer]    PT Start Time  0849   dry needling   PT Stop Time  0935    PT Time Calculation (min)  46 min    Activity Tolerance  Patient tolerated treatment well    Behavior During Therapy  [facility] for tasks assessed/performed       Past Medical History:  Diagnosis Date  . Cancer (HCC)    breast  . Dysplastic nevus syndrome   . Medical history non-contributory   . Osteopenia   . Yeast infection     Past Surgical History:  Procedure Laterality Date  . BREAST LUMPECTOMY    . GYNECOLOGIC CRYOSURGERY    . MOLE REMOVAL    . WISDOM TOOTH EXTRACTION      There were no vitals filed for this visit.  Subjective Assessment - 12/16/17 0852    Subjective  I am not doing good.  I got a stand up desk at work and it is so hard to adjust that I had to stand all day.  They are going to get me a higher chair so I can sit at it at the standing height.      Pertinent History  history of breast cancer (2005), osteopenia    Currently in Pain?  Yes    Pain Score  5     Pain Location  Back    Pain Orientation  Right;Left;Lower    Pain Descriptors / Indicators  Aching;Tightness;Sore    Pain Type  Chronic pain    Pain Onset  More than a month ago    Pain Frequency  Intermittent    Aggravating Factors   standing at work, in bed, sitting too long    Pain Relieving Factors  Aleve, change of position                       [facility] Adult PT Treatment/Exercise - 12/16/17 0001      Lumbar Exercises: Stretches   Single Knee to Chest  Stretch  --    Piriformis Stretch  --      Moist Heat Therapy   Number Minutes Moist Heat  10 Minutes    Moist Heat Location  Lumbar Spine;Hip      Manual Therapy   Manual Therapy  Soft tissue mobilization    Soft tissue mobilization  bil lumbar paraspinals, right piriformis, right gluteals       Trigger Point Dry Needling - 12/16/17 0855    Consent Given?  Yes    Muscles Treated Lower Body  Gluteus minimus;Gluteus maximus;Piriformis   bil lumbar multifidi   Gluteus Maximus Response  Twitch response elicited;Palpable increased muscle length    Gluteus Minimus Response  Twitch response elicited;Palpable increased muscle length    Piriformis Response  Twitch response elicited;Palpable increased muscle length             PT Short Term Goals - 12/16/17 0854      PT SHORT TERM GOAL #1   Title  be independent in  initial HEP    Time  4    Period  Weeks    Status  On-going        PT Long Term Goals - 12/07/17 1034      PT LONG TERM GOAL #1   Title  be independent in advanced HEP    Time  8    Period  Weeks    Status  New    Target Date  02/01/18      PT LONG TERM GOAL #2   Title  reduce FOTO to < or = to 31% limitation    Time  8    Period  Weeks    Status  New    Target Date  02/01/18      PT LONG TERM GOAL #3   Title  demonstrate neutral seated posture > or = to 50% of the time    Time  8    Period  Weeks    Target Date  02/01/18      PT LONG TERM GOAL #4   Title  report a 60% reduction in LBP and Rt LE pain with standing and negotiating steps    Time  8    Period  Weeks    Status  New    Target Date  02/01/18      PT LONG TERM GOAL #5   Title  return to regular exercise routine with modifications to reduce impact and avoid lumbar strain    Time  8    Period  Weeks    Status  New    Target Date  02/01/18            Plan - 12/16/17 0901    Clinical Impression Statement  Pt with flare-up of pain due to standing at work with standing desk.   Desk is not easy to adjust and she has had to stand too much.  Pt with tension and trigger points in bil lumbar paraspinals and gluteals and demonstrated improved tissue mobility after dry needling today.  Pt has been working on posture and alignment with standing at work and home tasks.  Pt will continue to benefit from skilled PT for manual, pain management, core strength and flexibility.      Rehab Potential  Good    PT Frequency  2x / week    PT Duration  8 weeks    PT Treatment/Interventions  ADLs/Self Care Home Management;Electrical Stimulation;Moist Heat;Functional mobility training;Therapeutic exercise;Therapeutic activities;Patient/family education;Neuromuscular re-education;Manual techniques;Passive range of motion;Taping;Dry needling    PT Next Visit Plan  assess response to dry needling #2 to Rt gluteals and lumbar multifidi, sidebending over roll to address scoliosis, review HEP, body mechanics education;  right gluteal strengthening; manual therapy     PT Home Exercise Plan  Access Code: [email or web address]    Consulted and Agree with Plan of Care  Patient       Patient will benefit from skilled therapeutic intervention in order to improve the following deficits and impairments:  Impaired flexibility, Decreased activity tolerance, Decreased strength, Decreased endurance, Postural dysfunction, Increased muscle spasms, Hypomobility, Improper body mechanics, Pain, Abnormal gait  Visit Diagnosis: Chronic bilateral low back pain with right-sided sciatica  Muscle weakness (generalized)  Stiffness of left hip, not elsewhere classified  Stiffness of right hip, not elsewhere classified  Cramp and spasm     Problem List Patient Active Problem List   Diagnosis Date Noted  . Sepsis ([place]) 11/13/2013  . SIRS (systemic inflammatory response syndrome) (  [place]) 11/13/2013  . Right knee pain 11/13/2013  . Sinusitis 11/13/2013  . Osteopenia   . Breast cancer ([place]) 01/26/2004     Sigurd Sos,  PT 12/16/17 9:30 AM  [facility] [address] [place], [place], [address] Phone: [phone]   Fax:  [phone]  Name: Victoria Hartman MRN: [id number] Date of Birth: 1960/06/12

## 2017-12-20 ENCOUNTER — Ambulatory Visit: Payer: Managed Care, Other (non HMO)

## 2017-12-20 DIAGNOSIS — M5441 Lumbago with sciatica, right side: Principal | ICD-10-CM

## 2017-12-20 DIAGNOSIS — M6281 Muscle weakness (generalized): Secondary | ICD-10-CM

## 2017-12-20 DIAGNOSIS — G8929 Other chronic pain: Secondary | ICD-10-CM

## 2017-12-20 DIAGNOSIS — M25651 Stiffness of right hip, not elsewhere classified: Secondary | ICD-10-CM

## 2017-12-20 DIAGNOSIS — M25652 Stiffness of left hip, not elsewhere classified: Secondary | ICD-10-CM

## 2017-12-20 DIAGNOSIS — R252 Cramp and spasm: Secondary | ICD-10-CM

## 2017-12-20 NOTE — Patient Instructions (Signed)
Lower abdominal/core stability exercises  1. Practice your breathing technique: Inhale through your nose expanding your belly and rib cage. Try not to breathe into your chest. Exhale slowly and gradually out your mouth feeling a sense of softness to your body. Practice multiple times. This can be performed unlimited.  2. Finding the lower abdominals. Laying on your back with the knees bent, place your fingers just below your belly button. Using your breathing technique from above, on your exhale gently pull the belly button away from your fingertips without tensing any other muscles. Practice this 5x. Next, as you exhale, draw belly button inwards and hold onto it...then feel as if you are pulling that muscle across your pelvis like you are tightening a belt. This can be hard to do at first so be patient and practice. Do 5-10 reps 1-3 x day. Always recognize quality over quantity; if your abdominal muscles become tired you will notice you may tighten/contract other muscles. This is the time to take a break.   Practice this first laying on your back, then in sitting, progressing to standing and finally adding it to all your daily movements.   3. Finding your pelvic floor. Using the breathing technique above, when your exhale, this time draw your pelvic floor muscles up as if you were attempting to stop the flow of urination. Be careful NOT to tense any other muscles. This can be hard, BE PATIENT. Try to hold up to 10 seconds repeating 10x. Try 2x a day. Once you feel you are doing this well, add this contraction to exercise #2. First contracting your pelvic floor followed by lower abdominals.   4. Adding leg movements. Add the following leg movements to challenge your ability to keep your core stable:  1. Single leg drop outs: Laying on your back with knees bent feet flat. Inhale,  dropping one knee outward KEEPING YOUR PELVIS STILL. Exhale as you bring the leg back, simultaneously performing your lower  abdominal contraction. Do 5-10 on each leg.   2. Marching: While keeping your pelvis still, lift the right foot a few inches, put it down then lift left foot. This will mimic a march. Start slow to establish control. Once you have control you may speed it up. Do 10-20x. You MUST keep your lower abdominlas contracted while you march. Breathe naturally      [facility] [address], [address] [place], [place] [address] Phone # [phone] Fax [phone] 

## 2017-12-20 NOTE — Therapy (Signed)
[facility] [address], [place], [place], [address] Phone: [phone]   Fax:  [phone]  Physical Therapy Treatment  Patient Details  Name: Victoria Hartman MRN: [id number] Date of Birth: [birth year] Referring Provider: Milagros Evener, MD   Encounter Date: 12/20/2017  PT End of Session - 12/20/17 0922    Visit Number  4    Date for PT Re-Evaluation  02/01/18    Authorization Type  Cigna    PT Start Time  0847    PT Stop Time  0927    PT Time Calculation (min)  40 min    Activity Tolerance  Patient tolerated treatment well    Behavior During Therapy  [facility] for tasks assessed/performed       Past Medical History:  Diagnosis Date  . Cancer (HCC)    breast  . Dysplastic nevus syndrome   . Medical history non-contributory   . Osteopenia   . Yeast infection     Past Surgical History:  Procedure Laterality Date  . BREAST LUMPECTOMY    . GYNECOLOGIC CRYOSURGERY    . MOLE REMOVAL    . WISDOM TOOTH EXTRACTION      There were no vitals filed for this visit.  Subjective Assessment - 12/20/17 0851    Subjective  I am feeling better than I was last week.  I lowered my desk and made my chair higher until I can get a new chair.      Currently in Pain?  Yes    Pain Score  1     Pain Location  Back    Pain Orientation  Right;Left;Lower    Pain Descriptors / Indicators  Aching;Tightness    Pain Type  Chronic pain    Pain Onset  More than a month ago    Pain Frequency  Intermittent    Aggravating Factors   standing at work, in bed, sitting too long    Pain Relieving Factors  Aleve, wearing tennis shoes                        [facility] Adult PT Treatment/Exercise - 12/20/17 0001      Exercises   Exercises  Knee/Hip;Lumbar      Pilates   Pilates Reformer  '      Lumbar Exercises: Stretches   Active Hamstring Stretch  3 reps;20 seconds;Left;Right    Piriformis Stretch  Right;Left;3 reps;20  seconds    Piriformis Stretch Limitations  supine      Lumbar Exercises: Aerobic   Nustep  Level 2 x 6 minutes   PT present to discuss progress     Lumbar Exercises: Supine   Ab Set  20 reps    AB Set Limitations  TA series with leg motions: drop outs and marching      Lumbar Exercises: Sidelying   Clam  Right;10 reps   good form     Lumbar Exercises: Prone   Other Prone Lumbar Exercises  Plank 3x10 seconds             PT Education - 12/20/17 0907    Education Details  TA exercises    Person(s) Educated  Patient    Methods  Explanation;Demonstration;Handout    Comprehension  Verbalized understanding;Returned demonstration       PT Short Term Goals - 12/20/17 0859      PT SHORT TERM GOAL #1   Title  be independent in initial HEP  Status  Achieved      PT SHORT TERM GOAL #2   Title  verbalize and demonstrate understanding of body mechanics and posture modifications for work and home tasks    Status  Achieved        PT Long Term Goals - 12/07/17 1034      PT LONG TERM GOAL #1   Title  be independent in advanced HEP    Time  8    Period  Weeks    Status  New    Target Date  02/01/18      PT LONG TERM GOAL #2   Title  reduce FOTO to < or = to 31% limitation    Time  8    Period  Weeks    Status  New    Target Date  02/01/18      PT LONG TERM GOAL #3   Title  demonstrate neutral seated posture > or = to 50% of the time    Time  8    Period  Weeks    Target Date  02/01/18      PT LONG TERM GOAL #4   Title  report a 60% reduction in LBP and Rt LE pain with standing and negotiating steps    Time  8    Period  Weeks    Status  New    Target Date  02/01/18      PT LONG TERM GOAL #5   Title  return to regular exercise routine with modifications to reduce impact and avoid lumbar strain    Time  8    Period  Weeks    Status  New    Target Date  02/01/18            Plan - 12/20/17 0853    Clinical Impression Statement  Pt with reduced pain  today and denies any pain, just stiffness.  Pt is making body mechanics modifications and desk modifications at work for frequent change of position for pain management.  Pt responded well to dry needling last session with improved flexibility with stretches over the weekend.  PT issued HEP for core strength today and pt required some verbal cues for activation.  Pt will continue to benefit from skilled PT for manual to address tension/trigger points, flexibility and core strength.      Rehab Potential  Good    PT Frequency  2x / week    PT Duration  8 weeks    PT Treatment/Interventions  ADLs/Self Care Home Management;Electrical Stimulation;Moist Heat;Functional mobility training;Therapeutic exercise;Therapeutic activities;Patient/family education;Neuromuscular re-education;Manual techniques;Passive range of motion;Taping;Dry needling    PT Next Visit Plan  DN to gluteals and multifidi, sidebending over roll.    PT Home Exercise Plan  Access Code: [email or web address]    Consulted and Agree with Plan of Care  Patient       Patient will benefit from skilled therapeutic intervention in order to improve the following deficits and impairments:  Impaired flexibility, Decreased activity tolerance, Decreased strength, Decreased endurance, Postural dysfunction, Increased muscle spasms, Hypomobility, Improper body mechanics, Pain, Abnormal gait  Visit Diagnosis: Chronic bilateral low back pain with right-sided sciatica  Muscle weakness (generalized)  Stiffness of left hip, not elsewhere classified  Stiffness of right hip, not elsewhere classified  Cramp and spasm     Problem List Patient Active Problem List   Diagnosis Date Noted  . Sepsis ([place]) 11/13/2013  . SIRS (systemic inflammatory response syndrome) ([place]) 11/13/2013  .  Right knee pain 11/13/2013  . Sinusitis 11/13/2013  . Osteopenia   . Breast cancer ([place]) 01/26/2004     Sigurd Sos, PT 12/20/17 9:29 AM  [facility] [address], [place], [place], [address] Phone: [phone]   Fax:  [phone]  Name: Victoria Hartman MRN: [id number] Date of Birth: Oct 03, 1960

## 2017-12-22 ENCOUNTER — Ambulatory Visit: Payer: Managed Care, Other (non HMO)

## 2017-12-22 DIAGNOSIS — M25651 Stiffness of right hip, not elsewhere classified: Secondary | ICD-10-CM

## 2017-12-22 DIAGNOSIS — M6281 Muscle weakness (generalized): Secondary | ICD-10-CM

## 2017-12-22 DIAGNOSIS — G8929 Other chronic pain: Secondary | ICD-10-CM

## 2017-12-22 DIAGNOSIS — M5441 Lumbago with sciatica, right side: Principal | ICD-10-CM

## 2017-12-22 DIAGNOSIS — R252 Cramp and spasm: Secondary | ICD-10-CM

## 2017-12-22 DIAGNOSIS — M25652 Stiffness of left hip, not elsewhere classified: Secondary | ICD-10-CM

## 2017-12-22 NOTE — Therapy (Signed)
[facility] [address], [place], [place], [address] Phone: [phone]   Fax:  [phone]  Physical Therapy Treatment  Patient Details  Name: Victoria Hartman MRN: [id number] Date of Birth: 1960/09/15 Referring Provider: Milagros Evener, MD   Encounter Date: 12/22/2017  PT End of Session - 12/22/17 0846    Visit Number  5    Date for PT Re-Evaluation  02/01/18    Authorization Type  [employer]    PT Start Time  0800    PT Stop Time  0855    PT Time Calculation (min)  55 min    Activity Tolerance  Patient tolerated treatment well    Behavior During Therapy  [facility] for tasks assessed/performed       Past Medical History:  Diagnosis Date  . Cancer (HCC)    breast  . Dysplastic nevus syndrome   . Medical history non-contributory   . Osteopenia   . Yeast infection     Past Surgical History:  Procedure Laterality Date  . BREAST LUMPECTOMY    . GYNECOLOGIC CRYOSURGERY    . MOLE REMOVAL    . WISDOM TOOTH EXTRACTION      There were no vitals filed for this visit.  Subjective Assessment - 12/22/17 0800    Subjective  I am doing good today.  I had a little more pain yesterday but I feel better now.      Currently in Pain?  Yes    Pain Score  1     Pain Orientation  Right;Left;Lower                       [facility] Adult PT Treatment/Exercise - 12/22/17 0001      Lumbar Exercises: Aerobic   Nustep  Level 2 x 7 minutes   PT present to discuss progress     Lumbar Exercises: Supine   Ab Set  20 reps    AB Set Limitations  TA series with leg motions: drop outs and marching      Moist Heat Therapy   Number Minutes Moist Heat  10 Minutes    Moist Heat Location  Lumbar Spine;Hip      Manual Therapy   Manual Therapy  Soft tissue mobilization    Soft tissue mobilization  bil lumbar paraspinals, right piriformis, right gluteals       Trigger Point Dry Needling - 12/22/17 0806    Consent Given?  Yes     Muscles Treated Lower Body  Gluteus minimus;Gluteus maximus;Piriformis   bil multifidi   Gluteus Maximus Response  Twitch response elicited;Palpable increased muscle length    Gluteus Minimus Response  Twitch response elicited;Palpable increased muscle length    Piriformis Response  Twitch response elicited;Palpable increased muscle length             PT Short Term Goals - 12/20/17 0859      PT SHORT TERM GOAL #1   Title  be independent in initial HEP    Status  Achieved      PT SHORT TERM GOAL #2   Title  verbalize and demonstrate understanding of body mechanics and posture modifications for work and home tasks    Status  Achieved        PT Long Term Goals - 12/07/17 1034      PT LONG TERM GOAL #1   Title  be independent in advanced HEP    Time  8  Period  Weeks    Status  New    Target Date  02/01/18      PT LONG TERM GOAL #2   Title  reduce FOTO to < or = to 31% limitation    Time  8    Period  Weeks    Status  New    Target Date  02/01/18      PT LONG TERM GOAL #3   Title  demonstrate neutral seated posture > or = to 50% of the time    Time  8    Period  Weeks    Target Date  02/01/18      PT LONG TERM GOAL #4   Title  report a 60% reduction in LBP and Rt LE pain with standing and negotiating steps    Time  8    Period  Weeks    Status  New    Target Date  02/01/18      PT LONG TERM GOAL #5   Title  return to regular exercise routine with modifications to reduce impact and avoid lumbar strain    Time  8    Period  Weeks    Status  New    Target Date  02/01/18            Plan - 12/22/17 0820    Clinical Impression Statement  Pt reports that pain is overall improved this week and pt has made modifications at home and work to improve body mechanics.  Pt required minor verbal cues with review of TA activation exercises due to upper trap activation and holding breath with this exercise.  Pt with tension and trigger points in bil lumbar  paraspinals and gluteals.  Pt demonstrated improved tissue mobility and reported reduced stiffness after dry needling today.  Pt will continue to benefit from skilled PT for core progression, hip strength, flexibility and  manual.      Rehab Potential  Good    PT Frequency  2x / week    PT Duration  8 weeks    PT Treatment/Interventions  ADLs/Self Care Home Management;Electrical Stimulation;Moist Heat;Functional mobility training;Therapeutic exercise;Therapeutic activities;Patient/family education;Neuromuscular re-education;Manual techniques;Passive range of motion;Taping;Dry needling    PT Next Visit Plan  Assess repsonse to dry needling, review core strength and add to core program as needed     PT Home Exercise Plan  Access Code: [email or web address]    Consulted and Agree with Plan of Care  Patient       Patient will benefit from skilled therapeutic intervention in order to improve the following deficits and impairments:  Impaired flexibility, Decreased activity tolerance, Decreased strength, Decreased endurance, Postural dysfunction, Increased muscle spasms, Hypomobility, Improper body mechanics, Pain, Abnormal gait  Visit Diagnosis: Chronic bilateral low back pain with right-sided sciatica  Muscle weakness (generalized)  Stiffness of left hip, not elsewhere classified  Stiffness of right hip, not elsewhere classified  Cramp and spasm     Problem List Patient Active Problem List   Diagnosis Date Noted  . Sepsis ([place]) 11/13/2013  . SIRS (systemic inflammatory response syndrome) ([place]) 11/13/2013  . Right knee pain 11/13/2013  . Sinusitis 11/13/2013  . Osteopenia   . Breast cancer ([place]) 01/26/2004     Sigurd Sos, PT 12/22/17 8:48 AM  [facility] [address] [place], [place], [address] Phone: [phone]   Fax:  [phone]  Name: Victoria Hartman MRN: [id number] Date of Birth: 09-22-1960

## 2017-12-28 ENCOUNTER — Ambulatory Visit: Payer: Managed Care, Other (non HMO) | Attending: Family Medicine

## 2017-12-28 DIAGNOSIS — G8929 Other chronic pain: Secondary | ICD-10-CM | POA: Insufficient documentation

## 2017-12-28 DIAGNOSIS — M6281 Muscle weakness (generalized): Secondary | ICD-10-CM | POA: Diagnosis present

## 2017-12-28 DIAGNOSIS — M25651 Stiffness of right hip, not elsewhere classified: Secondary | ICD-10-CM | POA: Diagnosis present

## 2017-12-28 DIAGNOSIS — M25652 Stiffness of left hip, not elsewhere classified: Secondary | ICD-10-CM | POA: Insufficient documentation

## 2017-12-28 DIAGNOSIS — R252 Cramp and spasm: Secondary | ICD-10-CM | POA: Diagnosis present

## 2017-12-28 DIAGNOSIS — M5441 Lumbago with sciatica, right side: Secondary | ICD-10-CM | POA: Insufficient documentation

## 2017-12-28 NOTE — Therapy (Addendum)
[facility] [address], [place], [place], [address] Phone: [phone]   Fax:  [phone]  Physical Therapy Treatment  Patient Details  Name: CYRA SPADER MRN: [id number] Date of Birth: 24-Aug-1960 Referring Provider: Milagros Evener, MD   Encounter Date: 12/28/2017  PT End of Session - 12/28/17 0933    Visit Number  6    Date for PT Re-Evaluation  02/01/18    Authorization Type  [employer]    PT Start Time  0848    PT Stop Time  0932    PT Time Calculation (min)  44 min    Activity Tolerance  Patient tolerated treatment well    Behavior During Therapy  [facility] for tasks assessed/performed       Past Medical History:  Diagnosis Date  . Cancer (HCC)    breast  . Dysplastic nevus syndrome   . Medical history non-contributory   . Osteopenia   . Yeast infection     Past Surgical History:  Procedure Laterality Date  . BREAST LUMPECTOMY    . GYNECOLOGIC CRYOSURGERY    . MOLE REMOVAL    . WISDOM TOOTH EXTRACTION      There were no vitals filed for this visit.  Subjective Assessment - 12/28/17 0900    Subjective  Pt reports she had a tough night last Wednesday into Thursday morning for pain.  She suspects that stress at work may contribute to her pain experience.  She is feeling her core exercises are going well and she prefers them in standing versus hooklying as she feels more awareness in standing.    Currently in Pain?  Yes    Pain Score  1     Pain Location  Back    Pain Orientation  Right    Pain Descriptors / Indicators  Aching;Tightness    Pain Type  Chronic pain    Pain Onset  More than a month ago    Pain Frequency  Intermittent    Aggravating Factors   prolonged standing at work, especially if wearing a mild heeled shoe    Pain Relieving Factors  wearing tennis shoes    Multiple Pain Sites  No                       [facility] Adult PT Treatment/Exercise - 12/28/17 0904      Exercises    Exercises  Knee/Hip;Lumbar      Lumbar Exercises: Stretches   Single Knee to Chest Stretch  Right;Left;3 reps;20 seconds    Piriformis Stretch  Right;Left;20 seconds;3 reps   seated figure 4 position     Lumbar Exercises: Aerobic   Nustep  Level 2 x 8 minutes   PT present to discuss progress     Lumbar Exercises: Standing   Other Standing Lumbar Exercises  Standing on foam, bilateral diagonal reaching red diagonal ball 2 sets of 10 both diagonals with core cueing      Lumbar Exercises: Seated   Hip Flexion on Ball  AROM;Both;20 reps   Pt needed single hand assist on table for balance assist   Other Seated Lumbar Exercises  Seated on ball 2lb shoulder flexion to 90 degrees with core muscles activated x 60 seconds      Lumbar Exercises: Supine   Bridge  10 reps;5 seconds   2 sets              PT Short Term Goals - 12/28/17 2423  PT SHORT TERM GOAL #3   Title  report a 25% reduction in LBP with standing and negotiating steps    Baseline  50% overall improvement    Status  Achieved        PT Long Term Goals - 12/07/17 1034      PT LONG TERM GOAL #1   Title  be independent in advanced HEP    Time  8    Period  Weeks    Status  New    Target Date  02/01/18      PT LONG TERM GOAL #2   Title  reduce FOTO to < or = to 31% limitation    Time  8    Period  Weeks    Status  New    Target Date  02/01/18      PT LONG TERM GOAL #3   Title  demonstrate neutral seated posture > or = to 50% of the time    Time  8    Period  Weeks    Target Date  02/01/18      PT LONG TERM GOAL #4   Title  report a 60% reduction in LBP and Rt LE pain with standing and negotiating steps    Time  8    Period  Weeks    Status  New    Target Date  02/01/18      PT LONG TERM GOAL #5   Title  return to regular exercise routine with modifications to reduce impact and avoid lumbar strain    Time  8    Period  Weeks    Status  New    Target Date  02/01/18             Plan - 12/28/17 0920    Clinical Impression Statement  Pt reports 50% overall improvement in symptoms since the start of care.  Pt is working to engage her core at work and with standing tasks.   Pt with intermittent flare-up of pain and this is significantly reduced.  PT focused on dynamic core exercises today and pt demonstrated core instability with seated ball exercises.  Pt with improved muscle tension in the gluteals and lumbar spine.  Pt will continue to benefit from skilled PT for core and hip strength progression and manual/dry needling to address trigger point.      Rehab Potential  Good    PT Frequency  2x / week    PT Duration  8 weeks    PT Treatment/Interventions  ADLs/Self Care Home Management;Electrical Stimulation;Moist Heat;Functional mobility training;Therapeutic exercise;Therapeutic activities;Patient/family education;Neuromuscular re-education;Manual techniques;Passive range of motion;Taping;Dry needling    PT Next Visit Plan  Dry needling to bil lumbar paraspinals and gluteals, core strength    PT Home Exercise Plan  Access Code: [email or web address]    Consulted and Agree with Plan of Care  Patient       Patient will benefit from skilled therapeutic intervention in order to improve the following deficits and impairments:  Impaired flexibility, Decreased activity tolerance, Decreased strength, Decreased endurance, Postural dysfunction, Increased muscle spasms, Hypomobility, Improper body mechanics, Pain, Abnormal gait  Visit Diagnosis: Chronic bilateral low back pain with right-sided sciatica  Muscle weakness (generalized)  Stiffness of left hip, not elsewhere classified  Stiffness of right hip, not elsewhere classified     Problem List Patient Active Problem List   Diagnosis Date Noted  . Sepsis ([place]) 11/13/2013  . SIRS (systemic inflammatory response syndrome) ([place]) 11/13/2013  .  Right knee pain 11/13/2013  . Sinusitis 11/13/2013  . Osteopenia   . Breast  cancer ([place]) 01/26/2004    Sigurd Sos, PT 12/28/17 9:36 AM Baruch Merl, PT 12/28/17 12:41 PM   [facility] [address] [place], [place], [address] Phone: [phone]   Fax:  [phone]  Name: SETAREH ROM MRN: [id number] Date of Birth: 1960/08/24

## 2017-12-30 ENCOUNTER — Ambulatory Visit: Payer: Managed Care, Other (non HMO)

## 2017-12-30 DIAGNOSIS — M25651 Stiffness of right hip, not elsewhere classified: Secondary | ICD-10-CM

## 2017-12-30 DIAGNOSIS — M6281 Muscle weakness (generalized): Secondary | ICD-10-CM

## 2017-12-30 DIAGNOSIS — M25652 Stiffness of left hip, not elsewhere classified: Secondary | ICD-10-CM

## 2017-12-30 DIAGNOSIS — M5441 Lumbago with sciatica, right side: Principal | ICD-10-CM

## 2017-12-30 DIAGNOSIS — G8929 Other chronic pain: Secondary | ICD-10-CM

## 2017-12-30 DIAGNOSIS — R252 Cramp and spasm: Secondary | ICD-10-CM

## 2017-12-30 NOTE — Therapy (Signed)
[facility] [address], [place], [place], [address] Phone: [phone]   Fax:  [phone]  Physical Therapy Treatment  Patient Details  Name: Victoria Hartman MRN: [id number] Date of Birth: [birth year] Referring Provider: Milagros Evener, MD   Encounter Date: 12/30/2017  PT End of Session - 12/30/17 0940    Visit Number  7    Date for PT Re-Evaluation  02/01/18    Authorization Type  Cigna    PT Start Time  0851    PT Stop Time  0946    PT Time Calculation (min)  55 min    Activity Tolerance  Patient tolerated treatment well    Behavior During Therapy  [facility] for tasks assessed/performed       Past Medical History:  Diagnosis Date  . Cancer (HCC)    breast  . Dysplastic nevus syndrome   . Medical history non-contributory   . Osteopenia   . Yeast infection     Past Surgical History:  Procedure Laterality Date  . BREAST LUMPECTOMY    . GYNECOLOGIC CRYOSURGERY    . MOLE REMOVAL    . WISDOM TOOTH EXTRACTION      There were no vitals filed for this visit.  Subjective Assessment - 12/30/17 0855    Subjective  I'm feeling good.  I'm just aware of my pain.     Pertinent History  history of breast cancer (2005), osteopenia    Currently in Pain?  Yes    Pain Score  1     Pain Location  Back    Pain Orientation  Right    Pain Descriptors / Indicators  Aching;Tightness    Pain Type  Chronic pain                       [facility] Adult PT Treatment/Exercise - 12/30/17 0001      Exercises   Exercises  Knee/Hip;Lumbar      Lumbar Exercises: Stretches   Piriformis Stretch  Right;Left;20 seconds;3 reps   seated figure 4 position     Lumbar Exercises: Sidelying   Clam  20 reps   tactile and demo cues for technique     Moist Heat Therapy   Number Minutes Moist Heat  10 Minutes    Moist Heat Location  Lumbar Spine;Hip      Manual Therapy   Manual Therapy  Soft tissue mobilization    Soft  tissue mobilization  bil lumbar paraspinals, right piriformis, right gluteals       Trigger Point Dry Needling - 12/30/17 0856    Consent Given?  Yes    Muscles Treated Lower Body  Gluteus minimus;Gluteus maximus;Piriformis   bil lumbar multifidi   Gluteus Maximus Response  Twitch response elicited;Palpable increased muscle length    Gluteus Minimus Response  Twitch response elicited;Palpable increased muscle length    Piriformis Response  Twitch response elicited;Palpable increased muscle length           PT Education - 12/30/17 0939    Education Details  discussion of how to do exercises at home due to taking a lot of time to do all exercises each day    Person(s) Educated  Patient    Methods  Explanation;Demonstration    Comprehension  Verbalized understanding       PT Short Term Goals - 12/28/17 0906      PT SHORT TERM GOAL #3   Title  report a 25% reduction  in LBP with standing and negotiating steps    Baseline  50% overall improvement    Status  Achieved        PT Long Term Goals - 12/07/17 1034      PT LONG TERM GOAL #1   Title  be independent in advanced HEP    Time  8    Period  Weeks    Status  New    Target Date  02/01/18      PT LONG TERM GOAL #2   Title  reduce FOTO to < or = to 31% limitation    Time  8    Period  Weeks    Status  New    Target Date  02/01/18      PT LONG TERM GOAL #3   Title  demonstrate neutral seated posture > or = to 50% of the time    Time  8    Period  Weeks    Target Date  02/01/18      PT LONG TERM GOAL #4   Title  report a 60% reduction in LBP and Rt LE pain with standing and negotiating steps    Time  8    Period  Weeks    Status  New    Target Date  02/01/18      PT LONG TERM GOAL #5   Title  return to regular exercise routine with modifications to reduce impact and avoid lumbar strain    Time  8    Period  Weeks    Status  New    Target Date  02/01/18            Plan - 12/30/17 0858    Clinical  Impression Statement  Pt reports 50% overall improvement in symptoms since the start of care.  Pt is working to engage her core at work and with standing tasks.   Pt with intermittent flare-up of pain and this is significantly reduced.  PT focused on discussion of how to schedule HEP and adjust for daily activity and dry needling.  Pt with trigger points and tension in Rt>Lt gluteals and lumbar multifidi and demonstrated improved tissue mobility and reduced pain after dry needling today.  Pt will continue to benefit from skilled PT for strength and flexibility progression and manual therapy for tissue mobility.         Rehab Potential  Good    PT Frequency  2x / week    PT Duration  8 weeks    PT Treatment/Interventions  ADLs/Self Care Home Management;Electrical Stimulation;Moist Heat;Functional mobility training;Therapeutic exercise;Therapeutic activities;Patient/family education;Neuromuscular re-education;Manual techniques;Passive range of motion;Taping;Dry needling    PT Next Visit Plan  core strength progression-add standing with band    PT Home Exercise Plan  Access Code: [email or web address]    Consulted and Agree with Plan of Care  Patient       Patient will benefit from skilled therapeutic intervention in order to improve the following deficits and impairments:  Impaired flexibility, Decreased activity tolerance, Decreased strength, Decreased endurance, Postural dysfunction, Increased muscle spasms, Hypomobility, Improper body mechanics, Pain, Abnormal gait  Visit Diagnosis: Chronic bilateral low back pain with right-sided sciatica  Muscle weakness (generalized)  Stiffness of right hip, not elsewhere classified  Cramp and spasm  Stiffness of left hip, not elsewhere classified     Problem List Patient Active Problem List   Diagnosis Date Noted  . Sepsis ([place]) 11/13/2013  . SIRS (systemic inflammatory response syndrome) ([facility])  11/13/2013  . Right knee pain 11/13/2013  . Sinusitis 11/13/2013   . Osteopenia   . Breast cancer ([place]) 01/26/2004    Sigurd Sos, PT 12/30/17 9:42 AM  [facility] [address] [place], [place], [address] Phone: [phone]   Fax:  [phone]  Name: Victoria Hartman MRN: [id number] Date of Birth: 1960-12-15

## 2018-01-04 ENCOUNTER — Ambulatory Visit: Payer: Managed Care, Other (non HMO)

## 2018-01-04 DIAGNOSIS — G8929 Other chronic pain: Secondary | ICD-10-CM

## 2018-01-04 DIAGNOSIS — R252 Cramp and spasm: Secondary | ICD-10-CM

## 2018-01-04 DIAGNOSIS — M25652 Stiffness of left hip, not elsewhere classified: Secondary | ICD-10-CM

## 2018-01-04 DIAGNOSIS — M25651 Stiffness of right hip, not elsewhere classified: Secondary | ICD-10-CM

## 2018-01-04 DIAGNOSIS — M5441 Lumbago with sciatica, right side: Principal | ICD-10-CM

## 2018-01-04 DIAGNOSIS — M6281 Muscle weakness (generalized): Secondary | ICD-10-CM

## 2018-01-04 NOTE — Therapy (Signed)
[facility] [address] [place], [place], [address] Phone: [phone]   Fax:  [phone]  Physical Therapy Treatment  Patient Details  Name: Victoria Hartman MRN: [id number] Date of Birth: 09-11-1960 Referring Provider: Milagros Evener, MD   Encounter Date: 01/04/2018  PT End of Session - 01/04/18 0843    Visit Number  8    Date for PT Re-Evaluation  02/01/18    PT Start Time  0803    PT Stop Time  0845    PT Time Calculation (min)  42 min    Activity Tolerance  Patient tolerated treatment well    Behavior During Therapy  [facility] for tasks assessed/performed       Past Medical History:  Diagnosis Date  . Cancer (HCC)    breast  . Dysplastic nevus syndrome   . Medical history non-contributory   . Osteopenia   . Yeast infection     Past Surgical History:  Procedure Laterality Date  . BREAST LUMPECTOMY    . GYNECOLOGIC CRYOSURGERY    . MOLE REMOVAL    . WISDOM TOOTH EXTRACTION      There were no vitals filed for this visit.  Subjective Assessment - 01/04/18 0806    Subjective  I have had a few bad days.  I had a terrible night of sleep on Friday.      Pertinent History  history of breast cancer (2005), osteopenia    Currently in Pain?  Yes    Pain Score  2     Pain Location  Back    Pain Orientation  Right;Lower    Pain Descriptors / Indicators  Aching;Tightness    Pain Type  Chronic pain    Pain Onset  More than a month ago    Pain Frequency  Intermittent    Aggravating Factors   prolonged standing at work, sitting in certain chairs.    Pain Relieving Factors  change of position, heat                       [facility] Adult PT Treatment/Exercise - 01/04/18 0001      Exercises   Exercises  Knee/Hip;Lumbar      Lumbar Exercises: Stretches   Single Knee to Chest Stretch  Right;Left;3 reps;20 seconds      Lumbar Exercises: Aerobic   Nustep  Level 3 x 8 minutes   PT present to  discuss progress     Lumbar Exercises: Standing   Row  Strengthening;Both;20 reps;Theraband    Theraband Level (Row)  Level 2 (Red)    Shoulder Extension  Strengthening;Both;20 reps;Theraband    Theraband Level (Shoulder Extension)  Level 2 (Red)      Lumbar Exercises: Sidelying   Clam  20 reps   tactile and demo cues for technique     Knee/Hip Exercises: Supine   Straight Leg Raises  Strengthening;Both;2 sets;10 reps   with abdominal bracing     Knee/Hip Exercises: Prone   Straight Leg Raises  --               PT Short Term Goals - 12/28/17 0906      PT SHORT TERM GOAL #3   Title  report a 25% reduction in LBP with standing and negotiating steps    Baseline  50% overall improvement    Status  Achieved        PT Long Term Goals - 01/04/18 6962  PT LONG TERM GOAL #1   Title  be independent in advanced HEP    Time  8    Period  Weeks    Status  On-going      PT LONG TERM GOAL #3   Title  demonstrate neutral seated posture > or = to 50% of the time    Status  Achieved            Plan - 01/04/18 0826    Clinical Impression Statement  Pt had flare-up of pain over the weekend with limited sleep.  Pt is feeling better now and has continued with HEP through this flare-up.  Pt is able to maintain neutral seated and standing posture > or = to 50% of the time.  Pt has chronic postural weakness and stiffness due to scoliosis.  Pt is consistent with bracing abdominals with all activity and no longer requires verbal cues to do this.  Pt will continue to benefit from skilled PT for core strength, dry needling/manual for tissue mobility and flexibility.      Rehab Potential  Good    PT Frequency  2x / week    PT Duration  8 weeks    PT Treatment/Interventions  ADLs/Self Care Home Management;Electrical Stimulation;Moist Heat;Functional mobility training;Therapeutic exercise;Therapeutic activities;Patient/family education;Neuromuscular re-education;Manual  techniques;Passive range of motion;Taping;Dry needling    PT Next Visit Plan  dry needling next session, core strength progression    PT Home Exercise Plan  Access Code: [email or web address]    Consulted and Agree with Plan of Care  Patient       Patient will benefit from skilled therapeutic intervention in order to improve the following deficits and impairments:  Impaired flexibility, Decreased activity tolerance, Decreased strength, Decreased endurance, Postural dysfunction, Increased muscle spasms, Hypomobility, Improper body mechanics, Pain, Abnormal gait  Visit Diagnosis: Chronic bilateral low back pain with right-sided sciatica  Muscle weakness (generalized)  Stiffness of right hip, not elsewhere classified  Cramp and spasm  Stiffness of left hip, not elsewhere classified     Problem List Patient Active Problem List   Diagnosis Date Noted  . Sepsis ([place]) 11/13/2013  . SIRS (systemic inflammatory response syndrome) ([place]) 11/13/2013  . Right knee pain 11/13/2013  . Sinusitis 11/13/2013  . Osteopenia   . Breast cancer ([place]) 01/26/2004    Victoria Hartman, PT 01/04/18 8:47 AM  [facility] [address], [place], [place], [address] Phone: [phone]   Fax:  [phone]  Name: Victoria Hartman MRN: [id number] Date of Birth: 1960/10/17

## 2018-01-06 ENCOUNTER — Ambulatory Visit: Payer: Managed Care, Other (non HMO)

## 2018-01-06 DIAGNOSIS — M25652 Stiffness of left hip, not elsewhere classified: Secondary | ICD-10-CM

## 2018-01-06 DIAGNOSIS — M25651 Stiffness of right hip, not elsewhere classified: Secondary | ICD-10-CM

## 2018-01-06 DIAGNOSIS — M5441 Lumbago with sciatica, right side: Principal | ICD-10-CM

## 2018-01-06 DIAGNOSIS — G8929 Other chronic pain: Secondary | ICD-10-CM

## 2018-01-06 DIAGNOSIS — M6281 Muscle weakness (generalized): Secondary | ICD-10-CM

## 2018-01-06 DIAGNOSIS — R252 Cramp and spasm: Secondary | ICD-10-CM

## 2018-01-06 NOTE — Therapy (Signed)
[facility] [address] [place], [place], [address] Phone: [phone]   Fax:  [phone]  Physical Therapy Treatment  Patient Details  Name: Victoria Hartman MRN: [id number] Date of Birth: 28-Dec-1960 Referring Provider: Milagros Evener, MD   Encounter Date: 01/06/2018  PT End of Session - 01/06/18 0849    Visit Number  9    Date for PT Re-Evaluation  02/01/18    Authorization Type  [employer]    PT Start Time  0758   dry needling   PT Stop Time  0854    PT Time Calculation (min)  56 min    Activity Tolerance  Patient tolerated treatment well    Behavior During Therapy  [facility] for tasks assessed/performed       Past Medical History:  Diagnosis Date  . Cancer (HCC)    breast  . Dysplastic nevus syndrome   . Medical history non-contributory   . Osteopenia   . Yeast infection     Past Surgical History:  Procedure Laterality Date  . BREAST LUMPECTOMY    . GYNECOLOGIC CRYOSURGERY    . MOLE REMOVAL    . WISDOM TOOTH EXTRACTION      There were no vitals filed for this visit.  Subjective Assessment - 01/06/18 0816    Subjective  I'm feeling pretty good.  My main issue is when I walk, I feel the Rt gluteals when I step with the Rt    Patient Stated Goals  exercise without pain, reduce Rt LE pain, reduce LBP    Currently in Pain?  Yes    Pain Score  1     Pain Location  Back    Pain Orientation  Right;Lower                       [facility] Adult PT Treatment/Exercise - 01/06/18 0001      Lumbar Exercises: Aerobic   Nustep  Level 3 x 15 minutes   attended by PT for 8 minutes     Moist Heat Therapy   Number Minutes Moist Heat  10 Minutes    Moist Heat Location  Lumbar Spine;Hip      Manual Therapy   Manual Therapy  Soft tissue mobilization    Soft tissue mobilization  bil lumbar paraspinals, right piriformis, right gluteals       Trigger Point Dry Needling - 01/06/18 0818    Consent Given?   Yes    Muscles Treated Lower Body  Gluteus minimus;Gluteus maximus;Piriformis    Gluteus Maximus Response  Twitch response elicited;Palpable increased muscle length    Gluteus Minimus Response  Twitch response elicited;Palpable increased muscle length    Piriformis Response  Twitch response elicited;Palpable increased muscle length             PT Short Term Goals - 12/28/17 0906      PT SHORT TERM GOAL #3   Title  report a 25% reduction in LBP with standing and negotiating steps    Baseline  50% overall improvement    Status  Achieved        PT Long Term Goals - 01/04/18 0539      PT LONG TERM GOAL #1   Title  be independent in advanced HEP    Time  8    Period  Weeks    Status  On-going      PT LONG TERM GOAL #3   Title  demonstrate  neutral seated posture > or = to 50% of the time    Status  Achieved            Plan - 01/06/18 0849    Clinical Impression Statement  Pt with low levels of Rt gluteal pain over the past week.  Pt reports Rt leg pain when taking a step and weightbearing on the Rt.  Pt has established a HEP for hip and core strength and flexibility.  Pt with mild to moderate tension in Rt gluteals and mild tension noted in lumbar spine with dry needling.  Pt with improved tissue mobility after dry needling today.  Pt will continue to benefit from skilled PT for core and hip strength and flexibility and manual therapy to address tension and trigger points.      Rehab Potential  Good    PT Frequency  2x / week    PT Duration  8 weeks    PT Treatment/Interventions  ADLs/Self Care Home Management;Electrical Stimulation;Moist Heat;Functional mobility training;Therapeutic exercise;Therapeutic activities;Patient/family education;Neuromuscular re-education;Manual techniques;Passive range of motion;Taping;Dry needling    PT Next Visit Plan  1 more dry needling session, issue HEP for standing core strength with theraband    PT Home Exercise Plan  Access Code:  [email or web address]    Consulted and Agree with Plan of Care  Patient       Patient will benefit from skilled therapeutic intervention in order to improve the following deficits and impairments:  Impaired flexibility, Decreased activity tolerance, Decreased strength, Decreased endurance, Postural dysfunction, Increased muscle spasms, Hypomobility, Improper body mechanics, Pain, Abnormal gait  Visit Diagnosis: Chronic bilateral low back pain with right-sided sciatica  Muscle weakness (generalized)  Stiffness of right hip, not elsewhere classified  Cramp and spasm  Stiffness of left hip, not elsewhere classified     Problem List Patient Active Problem List   Diagnosis Date Noted  . Sepsis ([place]) 11/13/2013  . SIRS (systemic inflammatory response syndrome) ([place]) 11/13/2013  . Right knee pain 11/13/2013  . Sinusitis 11/13/2013  . Osteopenia   . Breast cancer ([place]) 01/26/2004     Sigurd Sos, PT 01/06/18 8:51 AM  [facility] [address], [place], [place], [address] Phone: [phone]   Fax:  [phone]  Name: BULAR HICKOK MRN: [id number] Date of Birth: March 12, 1961

## 2018-01-10 ENCOUNTER — Ambulatory Visit: Payer: Managed Care, Other (non HMO)

## 2018-01-10 DIAGNOSIS — M6281 Muscle weakness (generalized): Secondary | ICD-10-CM

## 2018-01-10 DIAGNOSIS — M5441 Lumbago with sciatica, right side: Principal | ICD-10-CM

## 2018-01-10 DIAGNOSIS — G8929 Other chronic pain: Secondary | ICD-10-CM

## 2018-01-10 DIAGNOSIS — R252 Cramp and spasm: Secondary | ICD-10-CM

## 2018-01-10 DIAGNOSIS — M25651 Stiffness of right hip, not elsewhere classified: Secondary | ICD-10-CM

## 2018-01-10 DIAGNOSIS — M25652 Stiffness of left hip, not elsewhere classified: Secondary | ICD-10-CM

## 2018-01-10 NOTE — Therapy (Signed)
[facility] [address], [place], [place], [address] Phone: [phone]   Fax:  [phone]  Physical Therapy Treatment  Patient Details  Name: Victoria Hartman MRN: [id number] Date of Birth: 02-Feb-1961 Referring Provider: Milagros Evener, MD   Encounter Date: 01/10/2018 Progress Note Reporting Period 12/07/17 to 01/06/18  See note below for Objective Data and Assessment of Progress/Goals.      PT End of Session - 01/10/18 0931    Visit Number  10    Date for PT Re-Evaluation  02/01/18    Authorization Type  [employer]ign[employer]    PT Start Time  0847    PT Stop Time  0928    PT Time Calculation (min)  41 min    Activity Tolerance  Patient tolerated treatment well    Behavior During Therapy  [facility] for tasks assessed/performed       Past Medical History:  Diagnosis Date  . Cancer (HCC)    breast  . Dysplastic nevus syndrome   . Medical history non-contributory   . Osteopenia   . Yeast infection     Past Surgical History:  Procedure Laterality Date  . BREAST LUMPECTOMY    . GYNECOLOGIC CRYOSURGERY    . MOLE REMOVAL    . WISDOM TOOTH EXTRACTION      There were no vitals filed for this visit.  Subjective Assessment - 01/10/18 0853    Subjective  I am geeling good.  I was a little sore on the Rt after needling on Thursday and it resolved quickly.      Currently in Pain?  Yes    Pain Score  1     Pain Location  Back    Pain Orientation  Lower;Right    Pain Descriptors / Indicators  Aching;Tightness    Pain Type  Chronic pain    Pain Onset  More than a month ago    Pain Frequency  Intermittent    Aggravating Factors   prolonged standing at work, sitting in certain chairs    Pain Relieving Factors  change of position, heat         [facility] PT Assessment - 01/10/18 0001      Assessment   Medical Diagnosis  acute bilateral low back pain    Onset Date/Surgical Date  08/07/17   chronic     Observation/Other  Assessments   Focus on Therapeutic Outcomes (FOTO)   31% limitation                   [facility] Adult PT Treatment/Exercise - 01/10/18 0001      Exercises   Exercises  Knee/Hip;Lumbar      Lumbar Exercises: Stretches   Active Hamstring Stretch  3 reps;20 seconds;Left;Right    Figure 4 Stretch  3 reps;20 seconds      Lumbar Exercises: Aerobic   Nustep  Level 3 x 10 minutes   PT present to discuss progress     Lumbar Exercises: Standing   Row  Strengthening;Both;20 reps;Theraband    Theraband Level (Row)  Level 2 (Red)    Shoulder Extension  Strengthening;Both;20 reps;Theraband    Theraband Level (Shoulder Extension)  Level 2 (Red)    Other Standing Lumbar Exercises  horizontal abduction and ER with abdominal bracing red band 2x10               PT Short Term Goals - 12/28/17 0906      PT SHORT TERM GOAL #3  Title  report a 25% reduction in LBP with standing and negotiating steps    Baseline  50% overall improvement    Status  Achieved        PT Long Term Goals - 01/10/18 0906      PT LONG TERM GOAL #1   Title  be independent in advanced HEP    Baseline  independent in current HEP    Time  8    Period  Weeks    Status  On-going      PT LONG TERM GOAL #2   Title  reduce FOTO to < or = to 31% limitation    Baseline  31% limitation    Status  Achieved      PT LONG TERM GOAL #3   Title  demonstrate neutral seated posture > or = to 50% of the time    Status  Achieved      PT LONG TERM GOAL #4   Title  report a 60% reduction in LBP and Rt LE pain with standing and negotiating steps    Baseline  variable 50-80% improvement    Time  8    Period  Weeks    Status  On-going      PT LONG TERM GOAL #5   Title  return to regular exercise routine with modifications to reduce impact and avoid lumbar strain    Baseline  has not returned to this yet.  Focusing on HEP    Time  8    Period  Weeks    Status  On-going            Plan - 01/10/18 0909     Clinical Impression Statement  Pt reports 50-80% overall improvement in Rt lumbar symptoms since the start of care.  Pt with improved FOTO to 31% limitation (45% at evaluation).  Pt with improved Rt lumbar and gluteal tissue mobility after dry needling sessions over the past 4 weeks.  Pt demonstrates improved postural alignment and reports neutral posture ~50% of the time at home and work.  Pt is independent in HEP for core strength and hip flexibility.  Pt will continue to benefit from skilled PT for core strength advancement and manual to address trigger points and tension in Rt gluteals and bil lumbar multifidi.      Rehab Potential  Good    PT Frequency  2x / week    PT Duration  8 weeks    PT Treatment/Interventions  ADLs/Self Care Home Management;Electrical Stimulation;Moist Heat;Functional mobility training;Therapeutic exercise;Therapeutic activities;Patient/family education;Neuromuscular re-education;Manual techniques;Passive range of motion;Taping;Dry needling    PT Next Visit Plan  1 more dry needling session, reveiw HEP for standing theraband.      PT Home Exercise Plan  Access Code: [email or web address]    Consulted and Agree with Plan of Care  Patient       Patient will benefit from skilled therapeutic intervention in order to improve the following deficits and impairments:  Impaired flexibility, Decreased activity tolerance, Decreased strength, Decreased endurance, Postural dysfunction, Increased muscle spasms, Hypomobility, Improper body mechanics, Pain, Abnormal gait  Visit Diagnosis: Chronic bilateral low back pain with right-sided sciatica  Muscle weakness (generalized)  Stiffness of right hip, not elsewhere classified  Cramp and spasm  Stiffness of left hip, not elsewhere classified     Problem List Patient Active Problem List   Diagnosis Date Noted  . Sepsis ([place]) 11/13/2013  . SIRS (systemic inflammatory response syndrome) ([place]) 11/13/2013  . Right knee  pain 11/13/2013  .  Sinusitis 11/13/2013  . Osteopenia   . Breast cancer ([place]) 01/26/2004     Sigurd Sos, PT 01/10/18 9:34 AM  [facility] [address], [place], [place], [address] Phone: [phone]   Fax:  [phone]  Name: Victoria Hartman MRN: [id number] Date of Birth: 14-Oct-1960

## 2018-01-12 ENCOUNTER — Ambulatory Visit: Payer: Managed Care, Other (non HMO)

## 2018-01-12 DIAGNOSIS — G8929 Other chronic pain: Secondary | ICD-10-CM

## 2018-01-12 DIAGNOSIS — M6281 Muscle weakness (generalized): Secondary | ICD-10-CM

## 2018-01-12 DIAGNOSIS — M25652 Stiffness of left hip, not elsewhere classified: Secondary | ICD-10-CM

## 2018-01-12 DIAGNOSIS — M5441 Lumbago with sciatica, right side: Principal | ICD-10-CM

## 2018-01-12 DIAGNOSIS — R252 Cramp and spasm: Secondary | ICD-10-CM

## 2018-01-12 DIAGNOSIS — M25651 Stiffness of right hip, not elsewhere classified: Secondary | ICD-10-CM

## 2018-01-12 NOTE — Therapy (Signed)
[facility] [address], [place], [place], [address] Phone: [phone]   Fax:  [phone]  Physical Therapy Treatment  Patient Details  Name: Victoria Hartman MRN: [id number] Date of Birth: 03/16/1961 Referring Provider: Milagros Evener, MD   Encounter Date: 01/12/2018  PT End of Session - 01/12/18 0838    Visit Number  11    Date for PT Re-Evaluation  02/01/18    Authorization Type  [employer]a    PT Start Time  0800    PT Stop Time  0842    PT Time Calculation (min)  42 min    Activity Tolerance  Patient tolerated treatment well    Behavior During Therapy  [facility] for tasks assessed/performed       Past Medical History:  Diagnosis Date  . Cancer (HCC)    breast  . Dysplastic nevus syndrome   . Medical history non-contributory   . Osteopenia   . Yeast infection     Past Surgical History:  Procedure Laterality Date  . BREAST LUMPECTOMY    . GYNECOLOGIC CRYOSURGERY    . MOLE REMOVAL    . WISDOM TOOTH EXTRACTION      There were no vitals filed for this visit.  Subjective Assessment - 01/12/18 0805    Subjective  I am feeling discomfort when I step on the Rt side but really not bad.  No pain x 2 days.      Pertinent History  history of breast cancer (2005), osteopenia    Patient Stated Goals  exercise without pain, reduce Rt LE pain, reduce LBP    Currently in Pain?  Yes    Pain Score  1     Pain Location  Back                       [facility] Adult PT Treatment/Exercise - 01/12/18 0001      Exercises   Exercises  Knee/Hip;Lumbar      Lumbar Exercises: Stretches   Active Hamstring Stretch  3 reps;20 seconds;Left;Right    Figure 4 Stretch  3 reps;20 seconds      Lumbar Exercises: Aerobic   Nustep  Level 3 x 10 minutes   PT present to discuss progress     Lumbar Exercises: Standing   Row  Strengthening;Both;20 reps;Theraband    Theraband Level (Row)  Level 2 (Red)    Row Limitations   standing on black pad    Shoulder Extension  Strengthening;Both;20 reps;Theraband    Theraband Level (Shoulder Extension)  Level 2 (Red)    Shoulder Extension Limitations  standing on black pad    Other Standing Lumbar Exercises  horizontal abduction and ER with abdominal bracing red band 2x10   standing on black pad     Lumbar Exercises: Supine   Other Supine Lumbar Exercises  supine on foam roll: decompression for 3 minutes and horizontal abduction with red band 2x10      Lumbar Exercises: Quadruped   Other Quadruped Lumbar Exercises  prayer and lateral prayer (to Lt) 3x20 seconds each               PT Short Term Goals - 12/28/17 0906      PT SHORT TERM GOAL #3   Title  report a 25% reduction in LBP with standing and negotiating steps    Baseline  50% overall improvement    Status  Achieved        PT  Long Term Goals - 01/10/18 0906      PT LONG TERM GOAL #1   Title  be independent in advanced HEP    Baseline  independent in current HEP    Time  8    Period  Weeks    Status  On-going      PT LONG TERM GOAL #2   Title  reduce FOTO to < or = to 31% limitation    Baseline  31% limitation    Status  Achieved      PT LONG TERM GOAL #3   Title  demonstrate neutral seated posture > or = to 50% of the time    Status  Achieved      PT LONG TERM GOAL #4   Title  report a 60% reduction in LBP and Rt LE pain with standing and negotiating steps    Baseline  variable 50-80% improvement    Time  8    Period  Weeks    Status  On-going      PT LONG TERM GOAL #5   Title  return to regular exercise routine with modifications to reduce impact and avoid lumbar strain    Baseline  has not returned to this yet.  Focusing on HEP    Time  8    Period  Weeks    Status  On-going            Plan - 01/12/18 5366    Clinical Impression Statement  Pt reports 50-80% overall improvement in Rt lumbar symptoms since the start of care.  Pt with improved FOTO to 31% limitation  (45% at evaluation) this week.  Pt with improved Rt lumbar and gluteal tissue mobility after dry needling sessions over the past 4 weeks and dry needling was not needed today due to no significant pain.  Pt demonstrates improved postural alignment and reports neutral posture ~50% of the time at home and work.  Pt required tactile and demo cues for alignment and core activation with standing exercises.  Pt is independent in HEP for core strength and hip flexibility.  Pt will continue to benefit from skilled PT for core strength advancement and manual to address trigger points and tension in Rt gluteals and bil lumbar multifidi.       Rehab Potential  Good    PT Frequency  2x / week    PT Duration  8 weeks    PT Treatment/Interventions  ADLs/Self Care Home Management;Electrical Stimulation;Moist Heat;Functional mobility training;Therapeutic exercise;Therapeutic activities;Patient/family education;Neuromuscular re-education;Manual techniques;Passive range of motion;Taping;Dry needling    PT Next Visit Plan  1 more dry needling session, reveiw HEP for standing theraband.      PT Home Exercise Plan  Access Code: [email or web address]    Consulted and Agree with Plan of Care  Patient       Patient will benefit from skilled therapeutic intervention in order to improve the following deficits and impairments:  Impaired flexibility, Decreased activity tolerance, Decreased strength, Decreased endurance, Postural dysfunction, Increased muscle spasms, Hypomobility, Improper body mechanics, Pain, Abnormal gait  Visit Diagnosis: Chronic bilateral low back pain with right-sided sciatica  Muscle weakness (generalized)  Stiffness of right hip, not elsewhere classified  Cramp and spasm  Stiffness of left hip, not elsewhere classified     Problem List Patient Active Problem List   Diagnosis Date Noted  . Sepsis ([place]) 11/13/2013  . SIRS (systemic inflammatory response syndrome) ([place]) 11/13/2013  . Right knee pain  11/13/2013  .  Sinusitis 11/13/2013  . Osteopenia   . Breast cancer ([place]) 01/26/2004   Sigurd Sos, PT 01/12/18 8:39 AM  [facility] [address] [place], [place], [address] Phone: [phone]   Fax:  [phone]  Name: Victoria Hartman MRN: [id number] Date of Birth: 01/24/1961

## 2018-01-17 ENCOUNTER — Ambulatory Visit: Payer: Managed Care, Other (non HMO)

## 2018-01-17 DIAGNOSIS — M6281 Muscle weakness (generalized): Secondary | ICD-10-CM

## 2018-01-17 DIAGNOSIS — M25652 Stiffness of left hip, not elsewhere classified: Secondary | ICD-10-CM

## 2018-01-17 DIAGNOSIS — M5441 Lumbago with sciatica, right side: Principal | ICD-10-CM

## 2018-01-17 DIAGNOSIS — R252 Cramp and spasm: Secondary | ICD-10-CM

## 2018-01-17 DIAGNOSIS — M25651 Stiffness of right hip, not elsewhere classified: Secondary | ICD-10-CM

## 2018-01-17 DIAGNOSIS — G8929 Other chronic pain: Secondary | ICD-10-CM

## 2018-01-17 NOTE — Therapy (Signed)
[facility] [address], [place], [place], [address] Phone: [phone]   Fax:  [phone]  Physical Therapy Treatment  Patient Details  Name: Victoria Hartman MRN: [id number] Date of Birth: 10/22/1960 Referring Provider: Milagros Evener, MD   Encounter Date: 01/17/2018  PT End of Session - 01/17/18 0841    Visit Number  12    Date for PT Re-Evaluation  02/01/18    Authorization Type  [employer]    PT Start Time  0801    PT Stop Time  0843    PT Time Calculation (min)  42 min    Activity Tolerance  Patient tolerated treatment well    Behavior During Therapy  [facility] for tasks assessed/performed       Past Medical History:  Diagnosis Date  . Cancer (HCC)    breast  . Dysplastic nevus syndrome   . Medical history non-contributory   . Osteopenia   . Yeast infection     Past Surgical History:  Procedure Laterality Date  . BREAST LUMPECTOMY    . GYNECOLOGIC CRYOSURGERY    . MOLE REMOVAL    . WISDOM TOOTH EXTRACTION      There were no vitals filed for this visit.  Subjective Assessment - 01/17/18 0758    Subjective  I had 2 days of feeling better than I have felt in months.      Currently in Pain?  Yes    Pain Score  0-No pain    Pain Location  Back    Pain Orientation  Right;Lower    Pain Descriptors / Indicators  Aching;Tightness    Pain Type  Chronic pain    Pain Onset  More than a month ago    Pain Frequency  Intermittent    Aggravating Factors   prolonged standing at work, sitting in certain chairs    Pain Relieving Factors  chanig of position, sitting                       [facility] Adult PT Treatment/Exercise - 01/17/18 0001      Exercises   Exercises  Knee/Hip;Lumbar      Lumbar Exercises: Stretches   Active Hamstring Stretch  3 reps;20 seconds;Left;Right    Lower Trunk Rotation  3 reps;20 seconds   legs on red ball   Figure 4 Stretch  3 reps;20 seconds      Lumbar Exercises:  Aerobic   Nustep  Level 3 x 10 minutes   PT present to discuss progress     Lumbar Exercises: Seated   Other Seated Lumbar Exercises  seated on red ball: 1# weights 3 way raises 2x10      Lumbar Exercises: Supine   Bridge  20 reps;5 seconds   legs on red ball   Other Supine Lumbar Exercises  supine on foam roll: decompression for 3 minutes and horizontal abduction with red band 2x10      Knee/Hip Exercises: Seated   Sit to Sand  20 reps   abdominal bracing with yellow ball              PT Short Term Goals - 12/28/17 0906      PT SHORT TERM GOAL #3   Title  report a 25% reduction in LBP with standing and negotiating steps    Baseline  50% overall improvement    Status  Achieved        PT Long Term Goals -  01/17/18 0804      PT LONG TERM GOAL #1   Title  be independent in advanced HEP    Baseline  continuing to advance this program    Time  8    Period  Weeks    Status  On-going      PT LONG TERM GOAL #2   Title  reduce FOTO to < or = to 31% limitation    Status  Achieved      PT LONG TERM GOAL #3   Title  demonstrate neutral seated posture > or = to 50% of the time    Status  Achieved      PT LONG TERM GOAL #5   Title  return to regular exercise routine with modifications to reduce impact and avoid lumbar strain    Baseline  has not returned to this yet.  Focusing on HEP    Time  8    Period  Weeks    Status  On-going            Plan - 01/17/18 0810    Clinical Impression Statement  Pt reports 50-80% overall improvement in Rt lumbar symptoms since the start of care. Pt reports that she had the best 2 days that she has had in months.  Pt with improved Rt lumbar and gluteal tissue mobility after dry needling sessions over the past 4 weeks and dry needling was not needed today due to no significant pain.  Pt demonstrates improved postural alignment and reports neutral posture ~50% of the time at home and work.  Pt required tactile and demo cues for  alignment and core activation with standing exercises.  Pt is independent in HEP for core strength and hip flexibility.  Pt will continue to benefit from skilled PT for core strength advancement and manual to address trigger points and tension in Rt gluteals and bil lumbar multifidi.        Rehab Potential  Good    PT Frequency  2x / week    PT Duration  8 weeks    PT Treatment/Interventions  ADLs/Self Care Home Management;Electrical Stimulation;Moist Heat;Functional mobility training;Therapeutic exercise;Therapeutic activities;Patient/family education;Neuromuscular re-education;Manual techniques;Passive range of motion;Taping;Dry needling    PT Next Visit Plan  1 more dry needling session, reveiw HEP for standing theraband.      PT Home Exercise Plan  Access Code: [email or web address]    Consulted and Agree with Plan of Care  Patient       Patient will benefit from skilled therapeutic intervention in order to improve the following deficits and impairments:  Impaired flexibility, Decreased activity tolerance, Decreased strength, Decreased endurance, Postural dysfunction, Increased muscle spasms, Hypomobility, Improper body mechanics, Pain, Abnormal gait  Visit Diagnosis: Chronic bilateral low back pain with right-sided sciatica  Muscle weakness (generalized)  Stiffness of right hip, not elsewhere classified  Cramp and spasm  Stiffness of left hip, not elsewhere classified     Problem List Patient Active Problem List   Diagnosis Date Noted  . Sepsis ([place]) 11/13/2013  . SIRS (systemic inflammatory response syndrome) ([place]) 11/13/2013  . Right knee pain 11/13/2013  . Sinusitis 11/13/2013  . Osteopenia   . Breast cancer ([place]) 01/26/2004     Sigurd Sos, PT 01/17/18 8:44 AM  [facility] [address], [place], [place], [address] Phone: [phone]   Fax:  [phone]  Name: [name] MRN: [id number] Date of Birth:  1960-11-21

## 2018-01-26 ENCOUNTER — Ambulatory Visit: Payer: Managed Care, Other (non HMO) | Attending: Family Medicine

## 2018-01-26 DIAGNOSIS — M25651 Stiffness of right hip, not elsewhere classified: Secondary | ICD-10-CM | POA: Diagnosis present

## 2018-01-26 DIAGNOSIS — M5441 Lumbago with sciatica, right side: Secondary | ICD-10-CM | POA: Insufficient documentation

## 2018-01-26 DIAGNOSIS — G8929 Other chronic pain: Secondary | ICD-10-CM | POA: Diagnosis present

## 2018-01-26 DIAGNOSIS — M6281 Muscle weakness (generalized): Secondary | ICD-10-CM | POA: Diagnosis present

## 2018-01-26 DIAGNOSIS — R252 Cramp and spasm: Secondary | ICD-10-CM | POA: Insufficient documentation

## 2018-01-26 DIAGNOSIS — M25652 Stiffness of left hip, not elsewhere classified: Secondary | ICD-10-CM | POA: Diagnosis present

## 2018-01-26 NOTE — Therapy (Signed)
[facility] [address] [place], [place], [address] Phone: [phone]   Fax:  [phone]  Physical Therapy Treatment  Patient Details  Name: Victoria Hartman MRN: [id number] Date of Birth: August 21, 1960 Referring Provider (PT): Milagros Evener, MD   Encounter Date: 01/26/2018  PT End of Session - 01/26/18 0842    Visit Number  13    Date for PT Re-Evaluation  02/23/18    Authorization Type  [employer]    PT Start Time  0802    PT Stop Time  0850   dry needling   PT Time Calculation (min)  48 min    Activity Tolerance  Patient tolerated treatment well    Behavior During Therapy  [facility] for tasks assessed/performed       Past Medical History:  Diagnosis Date  . Cancer (HCC)    breast  . Dysplastic nevus syndrome   . Medical history non-contributory   . Osteopenia   . Yeast infection     Past Surgical History:  Procedure Laterality Date  . BREAST LUMPECTOMY    . GYNECOLOGIC CRYOSURGERY    . MOLE REMOVAL    . WISDOM TOOTH EXTRACTION      There were no vitals filed for this visit.  Subjective Assessment - 01/26/18 0803    Subjective  I have started back doing my regular exercise routine.  I have Rt gluteal pain with forward stepping and weightbearing on the Rt.     Pertinent History  history of breast cancer (2005), osteopenia    Currently in Pain?  Yes    Pain Score  2     Pain Location  Back    Pain Orientation  Right;Lower    Pain Descriptors / Indicators  Aching;Tightness    Pain Type  Chronic pain    Pain Onset  More than a month ago    Pain Frequency  Intermittent    Aggravating Factors   forward stepping and weightbearing on the Rt LE,     Pain Relieving Factors  not weightbearing on the Rt, change of position         [place] PT Assessment - 01/26/18 0001      Assessment   Medical Diagnosis  acute bilateral low back pain    Onset Date/Surgical Date  08/07/17   chronic     Cognition   Overall  Cognitive Status  Within Functional Limits for tasks assessed      Observation/Other Assessments   Focus on Therapeutic Outcomes (FOTO)   31% limitation                   [facility] Adult PT Treatment/Exercise - 01/26/18 0001      Lumbar Exercises: Aerobic   Nustep  Level 3 x 10 minutes   PT present to discuss progress     Manual Therapy   Manual Therapy  Soft tissue mobilization    Soft tissue mobilization  bil lumbar paraspinals, right piriformis, right gluteals       Trigger Point Dry Needling - 01/26/18 0814    Consent Given?  Yes    Muscles Treated Lower Body  Gluteus maximus;Gluteus minimus;Piriformis    Gluteus Maximus Response  Twitch response elicited;Palpable increased muscle length    Gluteus Minimus Response  Twitch response elicited;Palpable increased muscle length    Piriformis Response  Twitch response elicited;Palpable increased muscle length             PT Short Term  Goals - 12/28/17 0906      PT SHORT TERM GOAL #3   Title  report a 25% reduction in LBP with standing and negotiating steps    Baseline  50% overall improvement    Status  Achieved        PT Long Term Goals - 01/26/18 0815      PT LONG TERM GOAL #1   Title  be independent in advanced HEP    Time  4    Period  Weeks    Status  New    Target Date  02/23/18      PT LONG TERM GOAL #2   Title  reduce FOTO to < or = to 31% limitation    Baseline  31% limitation    Status  Achieved      PT LONG TERM GOAL #3   Title  demonstrate neutral seated posture > or = to 50% of the time    Status  Achieved      PT LONG TERM GOAL #4   Title  report a 60% reduction in LBP and Rt LE pain with standing and negotiating steps    Baseline  variable 50-80% improvement    Time  4    Period  Weeks    Status  New    Target Date  02/23/18      PT LONG TERM GOAL #5   Title  return to regular exercise routine with modifications to reduce impact and avoid lumbar strain    Baseline  pt has  returned to regular exercise with modifications    Time  4    Period  Weeks    Status  New    Target Date  02/23/18      Additional Long Term Goals   Additional Long Term Goals  Yes      PT LONG TERM GOAL #6   Title  report 50% reduction in Rt hip/LE pain with weightbearing on the Rt LE    Time  4    Period  Weeks    Status  New    Target Date  02/23/18            Plan - 01/26/18 0841    Clinical Impression Statement  Pt reports 50-80% overall improvement since the start of care.  Pt with continued low level pain reported 1-2/10 with weightbearing on the Rt LE.  Pt had returned to her regular exercise routine with some modifications.  Pt with significant improvement in tissue mobility and minimal to no trigger points in thoracic multifidi/paraspinals and Rt gluteals after dry needling and manual therapy today.  Pt will attend 1-4 more session to address continued core and hip weakness, manual and flexibility.      Rehab Potential  Good    PT Frequency  2x / week    PT Duration  8 weeks    PT Treatment/Interventions  ADLs/Self Care Home Management;Electrical Stimulation;Moist Heat;Functional mobility training;Therapeutic exercise;Therapeutic activities;Patient/family education;Neuromuscular re-education;Manual techniques;Passive range of motion;Taping;Dry needling    PT Next Visit Plan  1-4 more sessions.  continue core and hip strength.  manual as needed to lumbar spine and Rt gluteals.      PT Home Exercise Plan  Access Code: [email or web address]    Recommended Other Services  cert is signed, recert sent 93/2/67    Consulted and Agree with Plan of Care  Patient       Patient will benefit from skilled therapeutic intervention in order to  improve the following deficits and impairments:  Impaired flexibility, Decreased activity tolerance, Decreased strength, Decreased endurance, Postural dysfunction, Increased muscle spasms, Hypomobility, Improper body mechanics, Pain, Abnormal gait  Visit  Diagnosis: Chronic bilateral low back pain with right-sided sciatica - Plan: PT plan of care cert/re-cert  Muscle weakness (generalized) - Plan: PT plan of care cert/re-cert  Stiffness of right hip, not elsewhere classified - Plan: PT plan of care cert/re-cert  Cramp and spasm - Plan: PT plan of care cert/re-cert  Stiffness of left hip, not elsewhere classified - Plan: PT plan of care cert/re-cert     Problem List Patient Active Problem List   Diagnosis Date Noted  . Sepsis ([place]) 11/13/2013  . SIRS (systemic inflammatory response syndrome) ([place]) 11/13/2013  . Right knee pain 11/13/2013  . Sinusitis 11/13/2013  . Osteopenia   . Breast cancer ([place]) 01/26/2004    Sigurd Sos, PT 01/26/18 8:45 AM  [facility] [address] [place], [place], [address] Phone: [phone]   Fax:  [phone]  Name: Victoria Hartman MRN: [id number] Date of Birth: 01-27-1961

## 2018-02-02 ENCOUNTER — Ambulatory Visit: Payer: Managed Care, Other (non HMO)

## 2018-02-02 DIAGNOSIS — R252 Cramp and spasm: Secondary | ICD-10-CM

## 2018-02-02 DIAGNOSIS — M6281 Muscle weakness (generalized): Secondary | ICD-10-CM

## 2018-02-02 DIAGNOSIS — M25652 Stiffness of left hip, not elsewhere classified: Secondary | ICD-10-CM

## 2018-02-02 DIAGNOSIS — M25651 Stiffness of right hip, not elsewhere classified: Secondary | ICD-10-CM

## 2018-02-02 DIAGNOSIS — M5441 Lumbago with sciatica, right side: Principal | ICD-10-CM

## 2018-02-02 DIAGNOSIS — G8929 Other chronic pain: Secondary | ICD-10-CM

## 2018-02-02 NOTE — Therapy (Addendum)
[facility] [address] [place], [place], [address] Phone: [phone]   Fax:  [phone]  Physical Therapy Treatment  Patient Details  Name: Victoria Hartman MRN: [id number] Date of Birth: [birth year] Referring Provider (PT): Milagros Evener, MD   Encounter Date: 02/02/2018  PT End of Session - 02/02/18 0842    Visit Number  14    Date for PT Re-Evaluation  02/23/18    PT Start Time  0800    PT Stop Time  0839    PT Time Calculation (min)  39 min    Activity Tolerance  Patient tolerated treatment well    Behavior During Therapy  [facility] for tasks assessed/performed       Past Medical History:  Diagnosis Date  . Cancer (HCC)    breast  . Dysplastic nevus syndrome   . Medical history non-contributory   . Osteopenia   . Yeast infection     Past Surgical History:  Procedure Laterality Date  . BREAST LUMPECTOMY    . GYNECOLOGIC CRYOSURGERY    . MOLE REMOVAL    . WISDOM TOOTH EXTRACTION      There were no vitals filed for this visit.  Subjective Assessment - 02/02/18 0800    Subjective  I was sore for 2.5 days after dry needling and now I am feeling so much better.      Pertinent History  history of breast cancer (2005), osteopenia    Currently in Pain?  No/denies         [facility] PT Assessment - 02/02/18 0001      Assessment   Medical Diagnosis  acute bilateral low back pain    Onset Date/Surgical Date  08/07/17   chronic     Cognition   Overall Cognitive Status  Within Functional Limits for tasks assessed      Observation/Other Assessments   Focus on Therapeutic Outcomes (FOTO)   31% limitation                   [facility] Adult PT Treatment/Exercise - 02/02/18 0001      Exercises   Exercises  Knee/Hip;Lumbar      Lumbar Exercises: Stretches   Active Hamstring Stretch  3 reps;20 seconds;Left;Right    Figure 4 Stretch  3 reps;20 seconds      Lumbar Exercises: Aerobic   Nustep   Level 3 x 10 minutes   PT present to discuss progress     Lumbar Exercises: Seated   Other Seated Lumbar Exercises  seated on red ball: 1# weights 3 way raises 2x10      Lumbar Exercises: Sidelying   Clam  20 reps   yellow band added            PT Education - 02/02/18 0822    Education Details   Access Code: [email or web address]     Person(s) Educated  Patient    Methods  Explanation;Demonstration;Handout    Comprehension  Verbalized understanding;Returned demonstration       PT Short Term Goals - 12/28/17 0906      PT SHORT TERM GOAL #3   Title  report a 25% reduction in LBP with standing and negotiating steps    Baseline  50% overall improvement    Status  Achieved        PT Long Term Goals - 02/02/18 0805      PT LONG TERM GOAL #1   Title  be independent in advanced HEP  Status  Achieved      PT LONG TERM GOAL #2   Title  reduce FOTO to < or = to 31% limitation    Baseline  31% limitation    Status  Achieved      PT LONG TERM GOAL #3   Title  demonstrate neutral seated posture > or = to 50% of the time    Status  Achieved      PT LONG TERM GOAL #4   Title  report a 60% reduction in LBP and Rt LE pain with standing and negotiating steps    Status  Achieved      PT LONG TERM GOAL #5   Title  return to regular exercise routine with modifications to reduce impact and avoid lumbar strain    Baseline  pt has returned to regular exercise with some modifications    Time  4    Period  Weeks    Status  Partially Met      PT LONG TERM GOAL #6   Title  report 50% reduction in Rt hip/LE pain with weightbearing on the Rt LE    Time  --    Period  --    Status  Achieved            Plan - 02/02/18 0816    Clinical Impression Statement  Pt reports significant reduction in pain since last session.  Pt denies any pain with weightbearing on the Rt LE.  Pt has returned to her regular exercise routine and is making minor changes due to soreness in the Rt LE.  Otherwise,  pt is not feeling any limitation.  Pt tolerated advancement of exercise in prone and demonstrated all correctly.  Pt required minor tactile cues for correct technique.  PT issued yellow band for clam shell advancent and green band for arm exercises. Pt will be placed on hold until 02/23/18 with probable D/C at that time if pt doesn't need to return.        Rehab Potential  Good    PT Frequency  2x / week    PT Duration  8 weeks    PT Treatment/Interventions  ADLs/Self Care Home Management;Electrical Stimulation;Moist Heat;Functional mobility training;Therapeutic exercise;Therapeutic activities;Patient/family education;Neuromuscular re-education;Manual techniques;Passive range of motion;Taping;Dry needling    PT Next Visit Plan  Hold chart until 02/23/18 and D/C if pt doesnt return    PT Home Exercise Plan  Access Code: [email or web address]    Recommended Other Services  cert and recer signed    Consulted and Agree with Plan of Care  Patient       Patient will benefit from skilled therapeutic intervention in order to improve the following deficits and impairments:  Impaired flexibility, Decreased activity tolerance, Decreased strength, Decreased endurance, Postural dysfunction, Increased muscle spasms, Hypomobility, Improper body mechanics, Pain, Abnormal gait  Visit Diagnosis: Chronic bilateral low back pain with right-sided sciatica  Muscle weakness (generalized)  Stiffness of right hip, not elsewhere classified  Cramp and spasm  Stiffness of left hip, not elsewhere classified     Problem List Patient Active Problem List   Diagnosis Date Noted  . Sepsis ([place]) 11/13/2013  . SIRS (systemic inflammatory response syndrome) ([place]) 11/13/2013  . Right knee pain 11/13/2013  . Sinusitis 11/13/2013  . Osteopenia   . Breast cancer ([place]) 01/26/2004   Sigurd Sos, PT 02/02/18 8:43 AM PHYSICAL THERAPY DISCHARGE SUMMARY  Visits from Start of Care: 14  Current functional level related to goals /  functional  outcomes: See above for current PT status.   Remaining deficits: See above.     Education / Equipment: HEP, posture/body mechanics Plan: Patient agrees to discharge.  Patient goals were met. Patient is being discharged due to meeting the stated rehab goals.  ?????        Sigurd Sos, PT 02/23/18 7:55 AM   [facility] [address] [place], [place], [address] Phone: [phone]   Fax:  [phone]  Name: Victoria Hartman MRN: [id number] Date of Birth: [birth year]

## 2018-02-02 NOTE — Patient Instructions (Signed)
Access Code: [email or web address]  URL: [email or web address]  Date: 02/02/2018  Prepared by: Sigurd Sos   Exercises  Prone Alternating Arm and Leg Lifts - 10 reps - 2 sets - 1x daily - 7x weekly  Prone Hip Extension - One Pillow - 10 reps - 2 sets - 1x daily - 7x weekly  Prone Single Shoulder Flexion - 10 reps - 2 sets - 1x daily - 7x weekly

## 2019-07-22 ENCOUNTER — Ambulatory Visit: Payer: 59 | Attending: Internal Medicine

## 2019-07-22 DIAGNOSIS — Z23 Encounter for immunization: Secondary | ICD-10-CM

## 2019-07-22 NOTE — Progress Notes (Signed)
   Covid-19 Vaccination Clinic  Name:  Victoria Hartman    MRN: [id number] DOB: [birth year]  07/22/2019  [name] was observed post Covid-19 immunization for 15 minutes without incident. She was provided with Vaccine Information Sheet and instruction to access the V-Safe system.   [name] was instructed to call 911 with any severe reactions post vaccine: Marland Kitchen Difficulty breathing  . Swelling of face and throat  . A fast heartbeat  . A bad rash all over body  . Dizziness and weakness   Immunizations Administered    Name Date Dose VIS Date Route   Pfizer COVID-19 Vaccine 07/22/2019  1:07 PM 0.3 mL 04/07/2019 Intramuscular   Manufacturer: [place]   Lot: [id number]   [place]: [id number]

## 2019-08-16 ENCOUNTER — Ambulatory Visit: Payer: 59 | Attending: Internal Medicine

## 2019-08-16 DIAGNOSIS — Z23 Encounter for immunization: Secondary | ICD-10-CM

## 2019-08-16 NOTE — Progress Notes (Signed)
   Covid-19 Vaccination Clinic  Name:  Victoria Hartman    MRN: [id number] DOB: [birth year]  08/16/2019  Victoria Hartman was observed post Covid-19 immunization for 15 minutes without incident. She was provided with Vaccine Information Sheet and instruction to access the V-Safe system.   Victoria Hartman was instructed to call 911 with any severe reactions post vaccine: Marland Kitchen Difficulty breathing  . Swelling of face and throat  . A fast heartbeat  . A bad rash all over body  . Dizziness and weakness   Immunizations Administered    Name Date Dose VIS Date Route   Pfizer COVID-19 Vaccine 08/16/2019  8:50 AM 0.3 mL 06/21/2018 Intramuscular   Manufacturer: [place]   Lot: [id number]   [place]: [id number]

## 2019-12-22 DIAGNOSIS — Z1231 Encounter for screening mammogram for malignant neoplasm of breast: Secondary | ICD-10-CM | POA: Diagnosis not present

## 2020-01-30 DIAGNOSIS — Z23 Encounter for immunization: Secondary | ICD-10-CM | POA: Diagnosis not present

## 2020-03-04 DIAGNOSIS — H5213 Myopia, bilateral: Secondary | ICD-10-CM | POA: Diagnosis not present

## 2020-03-04 DIAGNOSIS — H52223 Regular astigmatism, bilateral: Secondary | ICD-10-CM | POA: Diagnosis not present

## 2020-04-09 DIAGNOSIS — Z13220 Encounter for screening for lipoid disorders: Secondary | ICD-10-CM | POA: Diagnosis not present

## 2020-04-09 DIAGNOSIS — Z0000 Encounter for general adult medical examination without abnormal findings: Secondary | ICD-10-CM | POA: Diagnosis not present

## 2020-04-09 DIAGNOSIS — M419 Scoliosis, unspecified: Secondary | ICD-10-CM | POA: Diagnosis not present

## 2020-04-09 DIAGNOSIS — Z131 Encounter for screening for diabetes mellitus: Secondary | ICD-10-CM | POA: Diagnosis not present

## 2020-04-09 DIAGNOSIS — M8580 Other specified disorders of bone density and structure, unspecified site: Secondary | ICD-10-CM | POA: Diagnosis not present

## 2020-04-09 DIAGNOSIS — K219 Gastro-esophageal reflux disease without esophagitis: Secondary | ICD-10-CM | POA: Diagnosis not present

## 2020-05-02 ENCOUNTER — Other Ambulatory Visit: Payer: Self-pay | Admitting: Family Medicine

## 2020-05-02 DIAGNOSIS — M8580 Other specified disorders of bone density and structure, unspecified site: Secondary | ICD-10-CM

## 2020-05-15 ENCOUNTER — Ambulatory Visit (INDEPENDENT_AMBULATORY_CARE_PROVIDER_SITE_OTHER): Payer: BC Managed Care – PPO | Admitting: Dermatology

## 2020-05-15 ENCOUNTER — Other Ambulatory Visit: Payer: Self-pay

## 2020-05-15 ENCOUNTER — Encounter: Payer: Self-pay | Admitting: Dermatology

## 2020-05-15 DIAGNOSIS — Z86018 Personal history of other benign neoplasm: Secondary | ICD-10-CM | POA: Diagnosis not present

## 2020-05-15 DIAGNOSIS — Z1283 Encounter for screening for malignant neoplasm of skin: Secondary | ICD-10-CM

## 2020-05-15 DIAGNOSIS — L918 Other hypertrophic disorders of the skin: Secondary | ICD-10-CM

## 2020-05-15 DIAGNOSIS — L821 Other seborrheic keratosis: Secondary | ICD-10-CM

## 2020-05-15 DIAGNOSIS — Z87898 Personal history of other specified conditions: Secondary | ICD-10-CM

## 2020-05-18 NOTE — Progress Notes (Signed)
   Follow-Up Visit   Subjective  Victoria Hartman is a 61 y.o. female who presents for the following: Annual Exam.  General skin examination Location:  Duration:  Quality:  Associated Signs/Symptoms: Modifying Factors:  Severity:  Timing: Context: History of multiple atypical moles  Objective  Well appearing patient in no apparent distress; mood and affect are within normal limits. Objective  Chest - Medial [facility]): Head to toe examination, no atypical moles new or recurrent.  Objective  Chest - Medial [facility]): Clean skin exam no recurrent pigmented spots.  Objective  Right Inframammary Fold: Under both breasts,  Tan textured flattopped 3 to 5 mm papules  Objective  Neck - Anterior: 1 mm pedunculated papule   A full examination was performed including scalp, head, eyes, ears, nose, lips, neck, chest, axillae, abdomen, back, buttocks, bilateral upper extremities, bilateral lower extremities, hands, feet, fingers, toes, fingernails, and toenails. All findings within normal limits unless otherwise noted below.   Assessment & Plan    History of atypical nevus Chest - Medial [facility])  Courage to self examine her skin twice annually, dermatologist yearly, continued ultraviolet protection.  Screening exam for skin cancer Chest - Medial [facility])  Annual skin examination  Seborrheic keratosis Right Inframammary Fold  Leave if stable  Skin tag Neck - Anterior  Patient content to leave this.     I, Lavonna Monarch, MD, have reviewed all documentation for this visit.  The documentation on 05/18/20 for the exam, diagnosis, procedures, and orders are all accurate and complete.

## 2020-08-26 ENCOUNTER — Other Ambulatory Visit: Payer: Self-pay

## 2020-08-26 ENCOUNTER — Ambulatory Visit
Admission: RE | Admit: 2020-08-26 | Discharge: 2020-08-26 | Disposition: A | Discharge status: Home / Self Care | Payer: 59 | Source: Ambulatory Visit | Attending: Family Medicine | Admitting: Family Medicine

## 2020-08-26 DIAGNOSIS — M8580 Other specified disorders of bone density and structure, unspecified site: Secondary | ICD-10-CM

## 2020-08-26 DIAGNOSIS — M8589 Other specified disorders of bone density and structure, multiple sites: Secondary | ICD-10-CM | POA: Diagnosis not present

## 2020-08-26 DIAGNOSIS — Z780 Asymptomatic menopausal state: Secondary | ICD-10-CM | POA: Diagnosis not present

## 2020-09-27 DIAGNOSIS — M5430 Sciatica, unspecified side: Secondary | ICD-10-CM | POA: Diagnosis not present

## 2020-10-11 DIAGNOSIS — M5431 Sciatica, right side: Secondary | ICD-10-CM | POA: Diagnosis not present

## 2020-10-18 DIAGNOSIS — M5431 Sciatica, right side: Secondary | ICD-10-CM | POA: Diagnosis not present

## 2020-10-21 DIAGNOSIS — M5431 Sciatica, right side: Secondary | ICD-10-CM | POA: Diagnosis not present

## 2020-11-01 DIAGNOSIS — M5431 Sciatica, right side: Secondary | ICD-10-CM | POA: Diagnosis not present

## 2020-11-04 DIAGNOSIS — M5431 Sciatica, right side: Secondary | ICD-10-CM | POA: Diagnosis not present

## 2020-11-08 DIAGNOSIS — M5431 Sciatica, right side: Secondary | ICD-10-CM | POA: Diagnosis not present

## 2020-11-11 DIAGNOSIS — M5431 Sciatica, right side: Secondary | ICD-10-CM | POA: Diagnosis not present

## 2020-11-15 DIAGNOSIS — M5431 Sciatica, right side: Secondary | ICD-10-CM | POA: Diagnosis not present

## 2020-11-18 DIAGNOSIS — M5431 Sciatica, right side: Secondary | ICD-10-CM | POA: Diagnosis not present

## 2020-11-22 DIAGNOSIS — M5431 Sciatica, right side: Secondary | ICD-10-CM | POA: Diagnosis not present

## 2020-11-25 DIAGNOSIS — M5431 Sciatica, right side: Secondary | ICD-10-CM | POA: Diagnosis not present

## 2020-11-29 DIAGNOSIS — M5431 Sciatica, right side: Secondary | ICD-10-CM | POA: Diagnosis not present

## 2020-12-02 DIAGNOSIS — M5431 Sciatica, right side: Secondary | ICD-10-CM | POA: Diagnosis not present

## 2020-12-06 DIAGNOSIS — M5431 Sciatica, right side: Secondary | ICD-10-CM | POA: Diagnosis not present

## 2020-12-09 DIAGNOSIS — U071 COVID-19: Secondary | ICD-10-CM | POA: Diagnosis not present

## 2020-12-20 DIAGNOSIS — M5431 Sciatica, right side: Secondary | ICD-10-CM | POA: Diagnosis not present

## 2020-12-23 DIAGNOSIS — M5431 Sciatica, right side: Secondary | ICD-10-CM | POA: Diagnosis not present

## 2020-12-27 DIAGNOSIS — M5431 Sciatica, right side: Secondary | ICD-10-CM | POA: Diagnosis not present

## 2020-12-27 DIAGNOSIS — Z1231 Encounter for screening mammogram for malignant neoplasm of breast: Secondary | ICD-10-CM | POA: Diagnosis not present

## 2021-01-03 DIAGNOSIS — M5431 Sciatica, right side: Secondary | ICD-10-CM | POA: Diagnosis not present

## 2021-01-28 DIAGNOSIS — Z23 Encounter for immunization: Secondary | ICD-10-CM | POA: Diagnosis not present

## 2021-03-11 DIAGNOSIS — H5213 Myopia, bilateral: Secondary | ICD-10-CM | POA: Diagnosis not present

## 2021-03-11 DIAGNOSIS — H52223 Regular astigmatism, bilateral: Secondary | ICD-10-CM | POA: Diagnosis not present

## 2021-04-01 DIAGNOSIS — H43391 Other vitreous opacities, right eye: Secondary | ICD-10-CM | POA: Diagnosis not present

## 2021-04-01 DIAGNOSIS — H2513 Age-related nuclear cataract, bilateral: Secondary | ICD-10-CM | POA: Diagnosis not present

## 2021-05-14 DIAGNOSIS — H2513 Age-related nuclear cataract, bilateral: Secondary | ICD-10-CM | POA: Diagnosis not present

## 2021-05-14 DIAGNOSIS — H43391 Other vitreous opacities, right eye: Secondary | ICD-10-CM | POA: Diagnosis not present

## 2021-05-21 ENCOUNTER — Encounter: Payer: Self-pay | Admitting: Dermatology

## 2021-05-21 ENCOUNTER — Ambulatory Visit (INDEPENDENT_AMBULATORY_CARE_PROVIDER_SITE_OTHER): Payer: BC Managed Care – PPO | Admitting: Dermatology

## 2021-05-21 ENCOUNTER — Other Ambulatory Visit: Payer: Self-pay

## 2021-05-21 DIAGNOSIS — Z1283 Encounter for screening for malignant neoplasm of skin: Secondary | ICD-10-CM | POA: Diagnosis not present

## 2021-05-21 DIAGNOSIS — M21612 Bunion of left foot: Secondary | ICD-10-CM

## 2021-05-21 DIAGNOSIS — D1801 Hemangioma of skin and subcutaneous tissue: Secondary | ICD-10-CM

## 2021-05-21 DIAGNOSIS — M21619 Bunion of unspecified foot: Secondary | ICD-10-CM

## 2021-05-21 DIAGNOSIS — L570 Actinic keratosis: Secondary | ICD-10-CM

## 2021-05-21 DIAGNOSIS — L821 Other seborrheic keratosis: Secondary | ICD-10-CM | POA: Diagnosis not present

## 2021-05-21 DIAGNOSIS — L918 Other hypertrophic disorders of the skin: Secondary | ICD-10-CM

## 2021-05-21 NOTE — Patient Instructions (Signed)
PRAMOXINE - MAIN INGREDIENT TO HELP WITH ITCHING

## 2021-06-13 ENCOUNTER — Encounter: Payer: Self-pay | Admitting: Dermatology

## 2021-06-13 NOTE — Progress Notes (Signed)
° °  Follow-Up Visit   Subjective  Victoria Hartman is a 61 y.o. female who presents for the following: Annual Exam (No new concerns. Personal history of atypical moles. ).  General skin examination Location:  Duration:  Quality:  Associated Signs/Symptoms: Modifying Factors:  Severity:  Timing: Context:   Objective  Well appearing patient in no apparent distress; mood and affect are within normal limits. Scalp General skin examination: No new or recurrent atypical pigmented lesions, no nonmelanoma skin cancer  Left Inframammary Fold, Left Upper Back (2), Right Inframammary Fold Flattopped brown textured 6 mm papules  Left Forearm - Anterior Horn like 4 mm pink crust  Left Hallux Metatarsophalangeal Joint Noninflamed bunion  Left Lower Leg - Anterior, Right Lower Leg - Anterior Multiple 1 mm smooth red dermal papules  Right Inframammary Fold 2 mm flesh-colored pedunculated papule    A full examination was performed including scalp, head, eyes, ears, nose, lips, neck, chest, axillae, abdomen, back, buttocks, bilateral upper extremities, bilateral lower extremities, hands, feet, fingers, toes, fingernails, and toenails. All findings within normal limits unless otherwise noted below.  Areas beneath undergarments not fully examined   Assessment & Plan    Screening exam for skin cancer Scalp  Annual skin examination, encouraged to self examine with spouse twice annually.  Continue ultraviolet protection.  Seborrheic keratosis (4) Left Inframammary Fold; Right Inframammary Fold; Left Upper Back (2)  Leave if stable.  For itching, SAMPLE OF PRAMOXINE GIVEN   Actinic keratosis Left Forearm - Anterior  Destruction of lesion - Left Forearm - Anterior Complexity: simple   Destruction method: cryotherapy   Informed consent: discussed and consent obtained   Timeout:  patient name, date of birth, surgical site, and procedure verified Lesion destroyed using liquid  nitrogen: Yes   Cryotherapy cycles:  3 Outcome: patient tolerated procedure well with no complications   Post-procedure details: wound care instructions given    Bunion Left Hallux Metatarsophalangeal Joint  Discussed new minimally invasive procedures.  Cherry angioma (2) Left Lower Leg - Anterior; Right Lower Leg - Anterior  No intervention indicated  Skin tag Right Inframammary Fold  Patient prefers to leave this be      I, Lavonna Monarch, MD, have reviewed all documentation for this visit.  The documentation on 06/13/21 for the exam, diagnosis, procedures, and orders are all accurate and complete.

## 2021-07-29 DIAGNOSIS — I10 Essential (primary) hypertension: Secondary | ICD-10-CM | POA: Diagnosis not present

## 2021-07-29 DIAGNOSIS — Z0001 Encounter for general adult medical examination with abnormal findings: Secondary | ICD-10-CM | POA: Diagnosis not present

## 2021-07-29 DIAGNOSIS — Z124 Encounter for screening for malignant neoplasm of cervix: Secondary | ICD-10-CM | POA: Diagnosis not present

## 2021-09-01 DIAGNOSIS — I10 Essential (primary) hypertension: Secondary | ICD-10-CM | POA: Diagnosis not present

## 2021-09-08 DIAGNOSIS — M25551 Pain in right hip: Secondary | ICD-10-CM | POA: Diagnosis not present

## 2021-09-08 DIAGNOSIS — M545 Low back pain, unspecified: Secondary | ICD-10-CM | POA: Diagnosis not present

## 2021-09-09 ENCOUNTER — Other Ambulatory Visit: Payer: Self-pay | Admitting: Sports Medicine

## 2021-09-09 ENCOUNTER — Ambulatory Visit
Admission: RE | Admit: 2021-09-09 | Discharge: 2021-09-09 | Disposition: A | Discharge status: Home / Self Care | Payer: BC Managed Care – PPO | Source: Ambulatory Visit | Attending: Sports Medicine | Admitting: Sports Medicine

## 2021-09-09 DIAGNOSIS — M25551 Pain in right hip: Secondary | ICD-10-CM

## 2021-09-09 DIAGNOSIS — M545 Low back pain, unspecified: Secondary | ICD-10-CM

## 2021-09-18 ENCOUNTER — Ambulatory Visit: Payer: BC Managed Care – PPO | Attending: Sports Medicine

## 2021-09-18 DIAGNOSIS — M25551 Pain in right hip: Secondary | ICD-10-CM | POA: Diagnosis not present

## 2021-09-18 DIAGNOSIS — M1611 Unilateral primary osteoarthritis, right hip: Secondary | ICD-10-CM | POA: Insufficient documentation

## 2021-09-18 DIAGNOSIS — M419 Scoliosis, unspecified: Secondary | ICD-10-CM | POA: Diagnosis not present

## 2021-09-18 DIAGNOSIS — R262 Difficulty in walking, not elsewhere classified: Secondary | ICD-10-CM | POA: Diagnosis not present

## 2021-09-18 DIAGNOSIS — M6281 Muscle weakness (generalized): Secondary | ICD-10-CM | POA: Diagnosis not present

## 2021-09-18 DIAGNOSIS — R252 Cramp and spasm: Secondary | ICD-10-CM

## 2021-09-18 DIAGNOSIS — R293 Abnormal posture: Secondary | ICD-10-CM | POA: Diagnosis not present

## 2021-09-18 DIAGNOSIS — M5459 Other low back pain: Secondary | ICD-10-CM | POA: Insufficient documentation

## 2021-09-18 DIAGNOSIS — M545 Low back pain, unspecified: Secondary | ICD-10-CM | POA: Insufficient documentation

## 2021-09-18 NOTE — Therapy (Signed)
OUTPATIENT PHYSICAL THERAPY THORACOLUMBAR EVALUATION   Patient Name: Victoria Hartman MRN: [id number] DOB:[birth year], 61 y.o., female Today's Date: 09/18/2021   PT End of Session - 09/18/21 1539     Visit Number 1    Date for PT Re-Evaluation 11/13/21    Authorization Type BCBS    PT Start Time 1530    PT Stop Time 1618    PT Time Calculation (min) 48 min    Activity Tolerance Patient tolerated treatment well    Behavior During Therapy [facility] for tasks assessed/performed             Past Medical History:  Diagnosis Date   Cancer ([place])    breast   Dysplastic nevus syndrome    Medical history non-contributory    Osteopenia    Yeast infection    Past Surgical History:  Procedure Laterality Date   BREAST LUMPECTOMY     GYNECOLOGIC CRYOSURGERY     MOLE REMOVAL     WISDOM TOOTH EXTRACTION     Patient Active Problem List   Diagnosis Date Noted   Sepsis ([place]) 11/13/2013   SIRS (systemic inflammatory response syndrome) ([place]) 11/13/2013   Right knee pain 11/13/2013   Sinusitis 11/13/2013   Osteopenia    Breast cancer ([place]) 01/26/2004    PCP: Aretta Nip, MD  REFERRING PROVIDER: Inez Catalina, MD   REFERRING DIAG: M16.11 (ICD-10-CM) - Unilateral primary osteoarthritis, right hip M54.50 (ICD-10-CM) - Low back pain, unspecified   Rationale for Evaluation and Treatment Rehabilitation  THERAPY DIAG:  Pain in right hip  Other low back pain  Abnormal posture  Cramp and spasm  Difficulty in walking, not elsewhere classified  Muscle weakness (generalized)  ONSET DATE: 09/11/21  SUBJECTIVE:                                                                                                                                                                                           SUBJECTIVE STATEMENT: Patient reports long history of scoliosis and mild back pain but recent worsening and having right LE pain.  She also feels some weakness in her right leg when  trying to ascend or descend steps.  She hopes to gain some control over her pain and be able to avoid her scoliosis getting worse.  PERTINENT HISTORY:  See above  PAIN:  Are you having pain? Yes: NPRS scale: 2/10 Pain location: right hip and low back Pain description: aching Aggravating factors: standing Relieving factors: rest   PRECAUTIONS: None  WEIGHT BEARING RESTRICTIONS No  FALLS:  Has patient fallen in last 6 months? No  OCCUPATION: works from home  PLOF:  Independent  PATIENT GOALS She hopes to gain some control over her pain and be able to avoid her scoliosis getting worse.    OBJECTIVE:   DIAGNOSTIC FINDINGS:  IMPRESSION: 1. Calcific density noted over the right upper quadrant. This could represent a gallstone or kidney stone.   2.  Stool noted throughout the colon.   3. Severe thoracolumbar spine scoliosis. Multilevel degenerative change thoracolumbar spine. Prominent degenerative changes both hips, most severe on the right. No acute bony abnormality.  Severe degenerative changes right hip with subchondral sclerosis and complete loss of joint space. Prominent subchondral cystic changes noted in the right femoral head and acetabulum are most likely degenerative. Lateral subluxation of the right hip noted, this is most likely degenerative. No evidence of fracture or dislocation. Pelvic calcifications consistent with phleboliths  PATIENT SURVEYS:  FOTO 42 (goal is 61)  SCREENING FOR RED FLAGS: Bowel or bladder incontinence: No Spinal tumors: No Cauda equina syndrome: No Compression fracture: No Abdominal aneurysm: No  COGNITION:  Overall cognitive status: Within functional limits for tasks assessed     SENSATION: WFL  MUSCLE LENGTH: Hamstrings: Right 50 deg; Left 60 deg Thomas test: Right pos ; Left pos   POSTURE:  left thoracic convex scoliosis  PALPATION: Soft tissue restriction bilateral thoracic and lumbar paraspinals.  LUMBAR ROM:    Active  A/PROM  eval  Flexion Fingertips to distal shin  Extension 75%  Right lateral flexion Fingertips to joint line  Left lateral flexion Fingertips to just above joint line  Right rotation WFL  Left rotation WFL   (Blank rows = not tested)  LOWER EXTREMITY ROM:     WFL  LOWER EXTREMITY MMT:    Generally 4 to 4+/5  LUMBAR SPECIAL TESTS:  Straight leg raise test: Negative  FUNCTIONAL TESTS:  5 times sit to stand: 10.54 sec GAIT: Distance walked: 50 Assistive device utilized: None Level of assistance: Complete Independence Comments: normal heel to toe progression    TODAY'S TREATMENT  Initiated HEP and initial eval completed   PATIENT EDUCATION:  Education details: Initiated HEP Person educated: Patient Education method: Consulting civil engineer, Media planner, Verbal cues, and Handouts Education comprehension: verbalized understanding, returned demonstration, and verbal cues required   HOME EXERCISE PROGRAM: Access Code: [email or web address] URL: [email or web address] Date: 09/18/2021 Prepared by: Candyce Churn  Exercises - Supine Hamstring Stretch with Strap  - 1 x daily - 7 x weekly - 1 sets - 3 reps - 30 sec hold - Supine ITB Stretch with Strap  - 1 x daily - 7 x weekly - 1 sets - 3 reps - 30 sec hold - Seated Piriformis Stretch with Trunk Bend  - 1 x daily - 7 x weekly - 1 sets - 3 reps - 30 sec hold  ASSESSMENT:  CLINICAL IMPRESSION: Patient is a 61 y.o. female who was seen today for physical therapy evaluation and treatment for low back and left hip pain.    OBJECTIVE IMPAIRMENTS Abnormal gait, decreased mobility, difficulty walking, decreased ROM, decreased strength, hypomobility, increased fascial restrictions, increased muscle spasms, impaired flexibility, postural dysfunction, and pain.   ACTIVITY LIMITATIONS carrying, lifting, bending, sitting, standing, squatting, sleeping, stairs, transfers, bed mobility, bathing, dressing, hygiene/grooming, and  caring for others  PARTICIPATION LIMITATIONS: meal prep, cleaning, laundry, interpersonal relationship, driving, shopping, community activity, yard work, and church  PERSONAL FACTORS Age, Fitness, Time since onset of injury/illness/exacerbation, and 1-2 comorbidities: hx cancer and osteopenia  are also affecting patient's functional outcome.   REHAB POTENTIAL: Good  CLINICAL DECISION  MAKING: Evolving/moderate complexity  EVALUATION COMPLEXITY: Moderate   GOALS: Goals reviewed with patient? Yes  SHORT TERM GOALS: Target date: 10/16/2021  Patient will be independent with initial HEP  Baseline: Goal status: INITIAL  2.  Pain report to be no greater than 4/10  Baseline:  Goal status: INITIAL   LONG TERM GOALS: Target date: 11/13/2021  Patient to be independent with advanced HEP  Baseline:  Goal status: INITIAL  2.  Patient to report pain no greater than 2/10  Baseline:  Goal status: INITIAL  3.  Patient to be able to sleep through the night  Baseline:  Goal status: INITIAL  4.  Patient to report 85% improvement in overall symptoms  Baseline:  Goal status: INITIAL  5.  Patient to be able to stand or walk for at least 15 min without leg pain  Baseline:  Goal status: INITIAL    PLAN: PT FREQUENCY: 1-2x/week  PT DURATION: 8 weeks  PLANNED INTERVENTIONS: Therapeutic exercises, Therapeutic activity, Neuromuscular re-education, Balance training, Gait training, Patient/Family education, Joint mobilization, Stair training, Aquatic Therapy, Dry Needling, Electrical stimulation, Spinal mobilization, Cryotherapy, Moist heat, Taping, Traction, Ultrasound, Ionotophoresis '4mg'$ /ml Dexamethasone, Manual therapy, and Re-evaluation.  PLAN FOR NEXT SESSION: Review HEP, NuStep, initiate core strengthening   Alisson Rozell B. Arnet Hofferber, PT 09/18/21 5:24 PM

## 2021-09-25 NOTE — Therapy (Signed)
OUTPATIENT PHYSICAL THERAPY TREATMENT NOTE   Patient Name: Victoria Hartman MRN: [id number] DOB:Dec 18, 1960, 61 y.o., female Today's Date: 09/26/2021   REFERRING PROVIDER: Inez Catalina, MD   END OF SESSION:   PT End of Session - 09/26/21 0936     Visit Number 2    Date for PT Re-Evaluation 11/13/21    Authorization Type BCBS    PT Start Time [phone]    PT Stop Time 1015    PT Time Calculation (min) 38 min    Activity Tolerance Patient tolerated treatment well             Past Medical History:  Diagnosis Date   Cancer ([place])    breast   Dysplastic nevus syndrome    Medical history non-contributory    Osteopenia    Yeast infection    Past Surgical History:  Procedure Laterality Date   BREAST LUMPECTOMY     GYNECOLOGIC CRYOSURGERY     MOLE REMOVAL     WISDOM TOOTH EXTRACTION     Patient Active Problem List   Diagnosis Date Noted   Sepsis ([place]) 11/13/2013   SIRS (systemic inflammatory response syndrome) ([place]) 11/13/2013   Right knee pain 11/13/2013   Sinusitis 11/13/2013   Osteopenia    Breast cancer ([place]) 01/26/2004    REFERRING DIAG: M16.11 (ICD-10-CM) - Unilateral primary osteoarthritis, right hip M54.50 (ICD-10-CM) - Low back pain, unspecified   THERAPY DIAG: Pain in right hip   Other low back pain   Abnormal posture   Cramp and spasm   Difficulty in walking, not elsewhere classified   Muscle weakness (generalized   Rationale for Evaluation and Treatment Rehabilitation  PERTINENT HISTORY: Patient reports long history of scoliosis and mild back pain but recent worsening and having right LE pain.  She also feels some weakness in her right leg when trying to ascend or descend steps.  She hopes to gain some control over her pain and be able to avoid her scoliosis getting worse.  PRECAUTIONS: osteopenia  SUBJECTIVE: I'd like to review the ex's. When I do the seated piriformis stretch it hurts.   I'd like keep from having spinal fusion and hip  replacement.   In PT a year ago and did well until the winter.    PAIN:  Are you having pain? Yes NPRS scale: 2-3/10  OBJECTIVE: (objective measures completed at initial evaluation unless otherwise dated)  OBJECTIVE:    DIAGNOSTIC FINDINGS:  IMPRESSION: 1. Calcific density noted over the right upper quadrant. This could represent a gallstone or kidney stone.   2.  Stool noted throughout the colon.   3. Severe thoracolumbar spine scoliosis. Multilevel degenerative change thoracolumbar spine. Prominent degenerative changes both hips, most severe on the right. No acute bony abnormality.   Severe degenerative changes right hip with subchondral sclerosis and complete loss of joint space. Prominent subchondral cystic changes noted in the right femoral head and acetabulum are most likely degenerative. Lateral subluxation of the right hip noted, this is most likely degenerative. No evidence of fracture or dislocation. Pelvic calcifications consistent with phleboliths   PATIENT SURVEYS:  FOTO 42 (goal is 61)   SCREENING FOR RED FLAGS: Bowel or bladder incontinence: No Spinal tumors: No Cauda equina syndrome: No Compression fracture: No Abdominal aneurysm: No   COGNITION:           Overall cognitive status: Within functional limits for tasks assessed  SENSATION: WFL   MUSCLE LENGTH: Hamstrings: Right 50 deg; Left 60 deg Thomas test: Right pos ; Left pos    POSTURE:  left thoracic convex scoliosis   PALPATION: Soft tissue restriction bilateral thoracic and lumbar paraspinals.   LUMBAR ROM:    Active  A/PROM  eval  Flexion Fingertips to distal shin  Extension 75%  Right lateral flexion Fingertips to joint line  Left lateral flexion Fingertips to just above joint line  Right rotation WFL  Left rotation WFL   (Blank rows = not tested)   LOWER EXTREMITY ROM:      WFL   LOWER EXTREMITY MMT:     Generally 4 to 4+/5   LUMBAR SPECIAL  TESTS:  Straight leg raise test: Negative   FUNCTIONAL TESTS:  5 times sit to stand: 10.54 sec GAIT: Distance walked: 50 Assistive device utilized: None Level of assistance: Complete Independence Comments: normal heel to toe progression       TODAY'S TREATMENT  6/1: Discussion results of imaging and "degenerative" and "arthritis" terminology Discussion of "slow and steady" mantra for recovery Review of HEP with modification of left supine piriformis place and hold instead of seated secondary to increased pain Supine decompression series per HEP  Therapeutic activity for standing, walking, and sitting postural improvement       PATIENT EDUCATION:  Education details: decompression series handout (not on Medbridge) Person educated: Patient Education method: Consulting civil engineer, Demonstration, Verbal cues, and Handouts Education comprehension: verbalized understanding, returned demonstration, and verbal cues required     HOME EXERCISE PROGRAM:  Decompression ex's handout Access Code: [email or web address] URL: [email or web address] Date: 09/18/2021 Prepared by: Candyce Churn   Exercises - Supine Hamstring Stretch with Strap  - 1 x daily - 7 x weekly - 1 sets - 3 reps - 30 sec hold - Supine ITB Stretch with Strap  - 1 x daily - 7 x weekly - 1 sets - 3 reps - 30 sec hold - Seated Piriformis Stretch with Trunk Bend  - 1 x daily - 7 x weekly - 1 sets - 3 reps - 30 sec hold   ASSESSMENT:   CLINICAL IMPRESSION:   Modified initial HEP secondary to inc pain reported.  Good response to decompression series for lengthening.  Extensive patient education on anatomical postural considerations and expected length of time for recovery/improvement.     OBJECTIVE IMPAIRMENTS Abnormal gait, decreased mobility, difficulty walking, decreased ROM, decreased strength, hypomobility, increased fascial restrictions, increased muscle spasms, impaired flexibility, postural dysfunction, and pain.     ACTIVITY LIMITATIONS carrying, lifting, bending, sitting, standing, squatting, sleeping, stairs, transfers, bed mobility, bathing, dressing, hygiene/grooming, and caring for others   PARTICIPATION LIMITATIONS: meal prep, cleaning, laundry, interpersonal relationship, driving, shopping, community activity, yard work, and church   PERSONAL FACTORS Age, Fitness, Time since onset of injury/illness/exacerbation, and 1-2 comorbidities: hx cancer and osteopenia  are also affecting patient's functional outcome.    REHAB POTENTIAL: Good   CLINICAL DECISION MAKING: Evolving/moderate complexity   EVALUATION COMPLEXITY: Moderate     GOALS: Goals reviewed with patient? Yes   SHORT TERM GOALS: Target date: 10/16/2021   Patient will be independent with initial HEP  Baseline: Goal status: INITIAL   2.  Pain report to be no greater than 4/10  Baseline:  Goal status: INITIAL     LONG TERM GOALS: Target date: 11/13/2021   Patient to be independent with advanced HEP  Baseline:  Goal status: INITIAL   2.  Patient to report pain no greater  than 2/10  Baseline:  Goal status: INITIAL   3.  Patient to be able to sleep through the night  Baseline:  Goal status: INITIAL   4.  Patient to report 85% improvement in overall symptoms  Baseline:  Goal status: INITIAL   5.  Patient to be able to stand or walk for at least 15 min without leg pain  Baseline:  Goal status: INITIAL       PLAN: PT FREQUENCY: 1-2x/week   PT DURATION: 8 weeks   PLANNED INTERVENTIONS: Therapeutic exercises, Therapeutic activity, Neuromuscular re-education, Balance training, Gait training, Patient/Family education, Joint mobilization, Stair training, Aquatic Therapy, Dry Needling, Electrical stimulation, Spinal mobilization, Cryotherapy, Moist heat, Taping, Traction, Ultrasound, Ionotophoresis '4mg'$ /ml Dexamethasone, Manual therapy, and Re-evaluation.   PLAN FOR NEXT SESSION: Review HEP, NuStep, decompression ex review  as needed;  core strengthening, supine band ex's for postural strengthening;  help patient get on [place] (having difficulty)  Ruben Im, PT 09/26/21 11:45 AM Phone: [phone] Fax: [phone]

## 2021-09-26 ENCOUNTER — Ambulatory Visit: Payer: BC Managed Care – PPO | Attending: Sports Medicine | Admitting: Physical Therapy

## 2021-09-26 DIAGNOSIS — M5459 Other low back pain: Secondary | ICD-10-CM | POA: Diagnosis not present

## 2021-09-26 DIAGNOSIS — R252 Cramp and spasm: Secondary | ICD-10-CM | POA: Diagnosis not present

## 2021-09-26 DIAGNOSIS — M25551 Pain in right hip: Secondary | ICD-10-CM

## 2021-09-26 DIAGNOSIS — R262 Difficulty in walking, not elsewhere classified: Secondary | ICD-10-CM | POA: Diagnosis not present

## 2021-09-26 DIAGNOSIS — M6281 Muscle weakness (generalized): Secondary | ICD-10-CM | POA: Insufficient documentation

## 2021-09-26 DIAGNOSIS — R293 Abnormal posture: Secondary | ICD-10-CM | POA: Diagnosis not present

## 2021-09-26 NOTE — Patient Instructions (Signed)
RE-ALIGNMENT ROUTINE EXERCISES-OSTEOPROROSIS BASIC FOR POSTURAL CORRECTION   RE-ALIGNMENT Tips BENEFITS: 1.It helps to re-align the curves of the back and improve standing posture. 2.It allows the back muscles to rest and strengthen in preparation for more activity. FREQUENCY: Daily, even after weeks, months and years of more advanced exercises. START: 1.All exercises start in the same position: lying on the back, arms resting on the supporting surface, palms up and slightly away from the body, backs of hands down, knees bent, feet flat. 2.The head, neck, arms, and legs are supported according to specific instructions of your therapist. Copyright  [employer]. All rights reserved.    1. Decompression Exercise: Basic.   Takes compression off the vertebral bodies; increases tolerance for lying on the back; helps relieve back pain   Lie on back on firm surface, knees bent, feet flat, arms turned up, out to sides (~35 degrees). Head neck and arms supported as necessary. Time _5-15__ minutes. Surface: floor     2. Shoulder Press  Strengthens upper back extensors and scapular retractors.   Press both shoulders down. Hold _2-3__ seconds. Repeat _3-5__ times. Surface: floor        3. Head Press With [place]  Strengthens neck extensors   Tuck chin SLIGHTLY toward chest, keep mouth closed. Feel weight on back of head. Increase weight by pressing head down. Hold _2-3__ seconds. Relax. Repeat 3-5___ times. Surface: floor     4. Leg Lengthener: stretches quadratus lumborum and hip flexors.  Strengthens quads and ankle dorsiflexors.  Leg Lengthener: Full    Straighten one leg. Pull toes AND forefoot toward knee, extend heel. Lengthen leg by pulling pelvis away from ribs. Hold __5_ seconds. Relax. Repeat 1 time. Re-bend knee. Do other leg. Each leg __5_ times. Surface: floor    Leg Lengthener / Leg Press Combo: Single Leg    Straighten one leg down to floor. Pull toes AND forefoot  toward knee; extend heel. Lengthen leg by pulling pelvis away from ribs. Press leg down. DO NOT BEND KNEE. Hold _5__ seconds. Relax leg. Repeat exercise 1 time. Relax leg. Re-bend knee. Repeat with other leg. Do 5 times    Victoria Hartman PT [facility] [address] [place], [place] [address] Phone # [phone] Fax [phone]

## 2021-09-30 ENCOUNTER — Ambulatory Visit: Payer: BC Managed Care – PPO | Admitting: Physical Therapy

## 2021-09-30 DIAGNOSIS — R262 Difficulty in walking, not elsewhere classified: Secondary | ICD-10-CM | POA: Diagnosis not present

## 2021-09-30 DIAGNOSIS — M25551 Pain in right hip: Secondary | ICD-10-CM

## 2021-09-30 DIAGNOSIS — M5459 Other low back pain: Secondary | ICD-10-CM

## 2021-09-30 DIAGNOSIS — R252 Cramp and spasm: Secondary | ICD-10-CM | POA: Diagnosis not present

## 2021-09-30 DIAGNOSIS — R293 Abnormal posture: Secondary | ICD-10-CM

## 2021-09-30 DIAGNOSIS — M6281 Muscle weakness (generalized): Secondary | ICD-10-CM | POA: Diagnosis not present

## 2021-09-30 NOTE — Therapy (Signed)
OUTPATIENT PHYSICAL THERAPY TREATMENT NOTE   Patient Name: Victoria Hartman MRN: [id number] DOB:1961/03/02, 61 y.o., female Today's Date: 09/30/2021   REFERRING PROVIDER: Inez Catalina, MD   END OF SESSION:   PT End of Session - 09/30/21 0845     Visit Number 3    Date for PT Re-Evaluation 11/13/21    Authorization Type BCBS    PT Start Time 0845    PT Stop Time 0925    PT Time Calculation (min) 40 min    Activity Tolerance Patient tolerated treatment well             Past Medical History:  Diagnosis Date   Cancer ([place])    breast   Dysplastic nevus syndrome    Medical history non-contributory    Osteopenia    Yeast infection    Past Surgical History:  Procedure Laterality Date   BREAST LUMPECTOMY     GYNECOLOGIC CRYOSURGERY     MOLE REMOVAL     WISDOM TOOTH EXTRACTION     Patient Active Problem List   Diagnosis Date Noted   Sepsis ([place]) 11/13/2013   SIRS (systemic inflammatory response syndrome) ([place]) 11/13/2013   Right knee pain 11/13/2013   Sinusitis 11/13/2013   Osteopenia    Breast cancer ([place]) 01/26/2004    REFERRING DIAG: M16.11 (ICD-10-CM) - Unilateral primary osteoarthritis, right hip M54.50 (ICD-10-CM) - Low back pain, unspecified   THERAPY DIAG: Pain in right hip   Other low back pain   Abnormal posture   Cramp and spasm   Difficulty in walking, not elsewhere classified   Muscle weakness (generalized   Rationale for Evaluation and Treatment Rehabilitation  PERTINENT HISTORY: Patient reports long history of scoliosis and mild back pain but recent worsening and having right LE pain.  She also feels some weakness in her right leg when trying to ascend or descend steps.  She hopes to gain some control over her pain and be able to avoid her scoliosis getting worse.  PRECAUTIONS: osteopenia  SUBJECTIVE:  The pain waxes and wanes.  One great night of sleep but then I feel it the next morning.  I feel it something on the right side.  Moderate  pain right low back at present.  Doing better with lying down piriformis stretch vs. Seated.  I like the decompression/realignment exercises.   I try not to take the [place] as much.    PAIN:  Are you having pain? Yes NPRS scale: "3.85"/10  OBJECTIVE: (objective measures completed at initial evaluation unless otherwise dated)  OBJECTIVE:    DIAGNOSTIC FINDINGS:  IMPRESSION: 1. Calcific density noted over the right upper quadrant. This could represent a gallstone or kidney stone.   2.  Stool noted throughout the colon.   3. Severe thoracolumbar spine scoliosis. Multilevel degenerative change thoracolumbar spine. Prominent degenerative changes both hips, most severe on the right. No acute bony abnormality.   Severe degenerative changes right hip with subchondral sclerosis and complete loss of joint space. Prominent subchondral cystic changes noted in the right femoral head and acetabulum are most likely degenerative. Lateral subluxation of the right hip noted, this is most likely degenerative. No evidence of fracture or dislocation. Pelvic calcifications consistent with phleboliths   PATIENT SURVEYS:  FOTO 42 (goal is 61)   SCREENING FOR RED FLAGS: Bowel or bladder incontinence: No Spinal tumors: No Cauda equina syndrome: No Compression fracture: No Abdominal aneurysm: No   COGNITION:           Overall  cognitive status: Within functional limits for tasks assessed                          SENSATION: WFL   MUSCLE LENGTH: Hamstrings: Right 50 deg; Left 60 deg Thomas test: Right pos ; Left pos    POSTURE:  left thoracic convex scoliosis   PALPATION: Soft tissue restriction bilateral thoracic and lumbar paraspinals.   LUMBAR ROM:    Active  A/PROM  eval  Flexion Fingertips to distal shin  Extension 75%  Right lateral flexion Fingertips to joint line  Left lateral flexion Fingertips to just above joint line  Right rotation WFL  Left rotation WFL   (Blank rows =  not tested)   LOWER EXTREMITY ROM:      WFL   LOWER EXTREMITY MMT:     Generally 4 to 4+/5   LUMBAR SPECIAL TESTS:  Straight leg raise test: Negative   FUNCTIONAL TESTS:  5 times sit to stand: 10.54 sec GAIT: Distance walked: 50 Assistive device utilized: None Level of assistance: Complete Independence Comments: normal heel to toe progression       TODAY'S TREATMENT  6/6  Review of supine decompression series and stretching HEP Supine shoulder press down 5x Supine leg lengthener 5x Supine whole leg press down 5x  Supine abdominal brace 5x Supine hand to opp knee isometric 5x Supine bent knee raise and lower 5x  Supine red band scapular series: overhead, horizontal abduction, sash, external rotation 5-8 reps each  Therapeutic activity for standing, walking, and sitting postural improvement    6/1: Discussion results of imaging and "degenerative" and "arthritis" terminology Discussion of "slow and steady" mantra for recovery Review of HEP with modification of left supine piriformis place and hold instead of seated secondary to increased pain Supine decompression series per HEP  Therapeutic activity for standing, walking, and sitting postural improvement       PATIENT EDUCATION:  Education details: decompression series handout (not on [place]) Person educated: Patient Education method: Consulting civil engineer, Demonstration, Verbal cues, and Handouts Education comprehension: verbalized understanding, returned demonstration, and verbal cues required     HOME EXERCISE PROGRAM: Access Code: [email or web address] URL: [email or web address] Date: 09/30/2021 Prepared by: Ruben Im  Exercises - Supine Hamstring Stretch with Strap  - 1 x daily - 7 x weekly - 1 sets - 3 reps - 30 sec hold - Supine ITB Stretch with Strap  - 1 x daily - 7 x weekly - 1 sets - 3 reps - 30 sec hold - Seated Piriformis Stretch with Trunk Bend  - 1 x daily - 7 x weekly - 1 sets - 3 reps - 30  sec hold - Hooklying Isometric Hip Flexion  - 1 x daily - 7 x weekly - 1 sets - 5 reps - Supine Bent Leg Lift with Knee Extension  - 1 x daily - 7 x weekly - 1 sets - 5 reps  Decompression ex's handout  Supine band scapular ex's     ASSESSMENT:   CLINICAL IMPRESSION:   Good carryover with decompression series.  Able to add progression to scapular stabilization with min cues to coordinate breathing for optimal benefit.  Therapist monitoring response throughout session.  She reports she "feels better" than she did when she arrived today.     OBJECTIVE IMPAIRMENTS Abnormal gait, decreased mobility, difficulty walking, decreased ROM, decreased strength, hypomobility, increased fascial restrictions, increased muscle spasms, impaired flexibility, postural dysfunction, and pain.    ACTIVITY LIMITATIONS  carrying, lifting, bending, sitting, standing, squatting, sleeping, stairs, transfers, bed mobility, bathing, dressing, hygiene/grooming, and caring for others   PARTICIPATION LIMITATIONS: meal prep, cleaning, laundry, interpersonal relationship, driving, shopping, community activity, yard work, and church   PERSONAL FACTORS Age, Fitness, Time since onset of injury/illness/exacerbation, and 1-2 comorbidities: hx cancer and osteopenia  are also affecting patient's functional outcome.    REHAB POTENTIAL: Good   CLINICAL DECISION MAKING: Evolving/moderate complexity   EVALUATION COMPLEXITY: Moderate     GOALS: Goals reviewed with patient? Yes   SHORT TERM GOALS: Target date: 10/16/2021   Patient will be independent with initial HEP  Baseline: Goal status: INITIAL   2.  Pain report to be no greater than 4/10  Baseline:  Goal status: INITIAL     LONG TERM GOALS: Target date: 11/13/2021   Patient to be independent with advanced HEP  Baseline:  Goal status: INITIAL   2.  Patient to report pain no greater than 2/10  Baseline:  Goal status: INITIAL   3.  Patient to be able to sleep  through the night  Baseline:  Goal status: INITIAL   4.  Patient to report 85% improvement in overall symptoms  Baseline:  Goal status: INITIAL   5.  Patient to be able to stand or walk for at least 15 min without leg pain  Baseline:  Goal status: INITIAL       PLAN: PT FREQUENCY: 1-2x/week   PT DURATION: 8 weeks   PLANNED INTERVENTIONS: Therapeutic exercises, Therapeutic activity, Neuromuscular re-education, Balance training, Gait training, Patient/Family education, Joint mobilization, Stair training, Aquatic Therapy, Dry Needling, Electrical stimulation, Spinal mobilization, Cryotherapy, Moist heat, Taping, Traction, Ultrasound, Ionotophoresis '4mg'$ /ml Dexamethasone, Manual therapy, and Re-evaluation.   PLAN FOR NEXT SESSION: Review HEP, start band rows and extensions;  seated thoracic extension;  all single arm variations to red band scap series;  NuStep,   core strengthening,   help patient get on [place] (having difficulty)    Ruben Im, PT 09/30/21 7:31 PM Phone: [phone] Fax: [phone]

## 2021-09-30 NOTE — Patient Instructions (Signed)
EXHALE ON THE HARD PART OF THE EXERCISE  Over Head Pull: Narrow Grip        On back, knees bent, feet flat, band across thighs, elbows straight but relaxed. Pull hands apart (start). Keeping elbows straight, bring arms up and over head, hands toward floor. Keep pull steady on band. Hold momentarily. Return slowly, keeping pull steady, back to start. Repeat _5__ times. Band color ___RED___   Side Pull: Double Arm   On back, knees bent, feet flat. Arms perpendicular to body, shoulder level, elbows straight but relaxed. Pull arms out to sides, elbows straight. Resistance band comes across collarbones, hands toward floor. Hold momentarily. Slowly return to starting position. Repeat _5__ times. Band color __RED___   Sash   On back, knees bent, feet flat, left hand on left hip, right hand above left. Pull right arm DIAGONALLY (hip to shoulder) across chest. Bring right arm along head toward floor. Hold momentarily. Slowly return to starting position. Repeat _5__ times. Do with left arm. Band color __RED____   Shoulder Rotation: Double Arm   On back, knees bent, feet flat, elbows tucked at sides, bent 90, hands palms up. Pull hands apart and down toward floor, keeping elbows near sides. Hold momentarily. Slowly return to starting position. Repeat __5_ times. Band color __RED____    [facility] [address] [place], [place] [address] Phone # [phone] Fax [phone]

## 2021-10-03 ENCOUNTER — Ambulatory Visit: Payer: BC Managed Care – PPO | Admitting: Physical Therapy

## 2021-10-03 DIAGNOSIS — M6281 Muscle weakness (generalized): Secondary | ICD-10-CM | POA: Diagnosis not present

## 2021-10-03 DIAGNOSIS — R293 Abnormal posture: Secondary | ICD-10-CM | POA: Diagnosis not present

## 2021-10-03 DIAGNOSIS — R262 Difficulty in walking, not elsewhere classified: Secondary | ICD-10-CM | POA: Diagnosis not present

## 2021-10-03 DIAGNOSIS — R252 Cramp and spasm: Secondary | ICD-10-CM | POA: Diagnosis not present

## 2021-10-03 DIAGNOSIS — M25551 Pain in right hip: Secondary | ICD-10-CM

## 2021-10-03 DIAGNOSIS — M5459 Other low back pain: Secondary | ICD-10-CM | POA: Diagnosis not present

## 2021-10-03 NOTE — Therapy (Signed)
OUTPATIENT PHYSICAL THERAPY TREATMENT NOTE   Patient Name: Victoria Hartman MRN: [id number] DOB:1961-03-07, 61 y.o., female Today's Date: 10/03/2021   REFERRING PROVIDER: Inez Catalina, MD   END OF SESSION:   PT End of Session - 10/03/21 0852     Visit Number 4    Date for PT Re-Evaluation 11/13/21    Authorization Type BCBS    PT Start Time [phone]   late   PT Stop Time 0930    PT Time Calculation (min) 38 min    Activity Tolerance Patient tolerated treatment well             Past Medical History:  Diagnosis Date   Cancer ([place])    breast   Dysplastic nevus syndrome    Medical history non-contributory    Osteopenia    Yeast infection    Past Surgical History:  Procedure Laterality Date   BREAST LUMPECTOMY     GYNECOLOGIC CRYOSURGERY     MOLE REMOVAL     WISDOM TOOTH EXTRACTION     Patient Active Problem List   Diagnosis Date Noted   Sepsis ([place]) 11/13/2013   SIRS (systemic inflammatory response syndrome) ([place]) 11/13/2013   Right knee pain 11/13/2013   Sinusitis 11/13/2013   Osteopenia    Breast cancer ([place]) 01/26/2004    REFERRING DIAG: M16.11 (ICD-10-CM) - Unilateral primary osteoarthritis, right hip M54.50 (ICD-10-CM) - Low back pain, unspecified   THERAPY DIAG: Pain in right hip   Other low back pain   Abnormal posture   Cramp and spasm   Difficulty in walking, not elsewhere classified   Muscle weakness (generalized   Rationale for Evaluation and Treatment Rehabilitation  PERTINENT HISTORY: Patient reports long history of scoliosis and mild back pain but recent worsening and having right LE pain.  She also feels some weakness in her right leg when trying to ascend or descend steps.  She hopes to gain some control over her pain and be able to avoid her scoliosis getting worse.  PRECAUTIONS: osteopenia  SUBJECTIVE:  Pain level the same as last time.  Right hip concentrated and will sometimes refer to calf/thigh.  Moderate limp.     PAIN:  Are  you having pain? Yes NPRS scale: "3.85"/10  OBJECTIVE: (objective measures completed at initial evaluation unless otherwise dated)  OBJECTIVE:    DIAGNOSTIC FINDINGS:  IMPRESSION: 1. Calcific density noted over the right upper quadrant. This could represent a gallstone or kidney stone.   2.  Stool noted throughout the colon.   3. Severe thoracolumbar spine scoliosis. Multilevel degenerative change thoracolumbar spine. Prominent degenerative changes both hips, most severe on the right. No acute bony abnormality.   Severe degenerative changes right hip with subchondral sclerosis and complete loss of joint space. Prominent subchondral cystic changes noted in the right femoral head and acetabulum are most likely degenerative. Lateral subluxation of the right hip noted, this is most likely degenerative. No evidence of fracture or dislocation. Pelvic calcifications consistent with phleboliths   PATIENT SURVEYS:  FOTO 42 (goal is 61)   SCREENING FOR RED FLAGS: Bowel or bladder incontinence: No Spinal tumors: No Cauda equina syndrome: No Compression fracture: No Abdominal aneurysm: No   COGNITION:           Overall cognitive status: Within functional limits for tasks assessed                          SENSATION: WFL   MUSCLE LENGTH: Hamstrings:  Right 50 deg; Left 60 deg Thomas test: Right pos ; Left pos    POSTURE:  left thoracic convex scoliosis   PALPATION: Soft tissue restriction bilateral thoracic and lumbar paraspinals.   LUMBAR ROM:    Active  A/PROM  eval  Flexion Fingertips to distal shin  Extension 75%  Right lateral flexion Fingertips to joint line  Left lateral flexion Fingertips to just above joint line  Right rotation WFL  Left rotation WFL   (Blank rows = not tested)   LOWER EXTREMITY ROM:      WFL   LOWER EXTREMITY MMT:     Generally 4 to 4+/5   LUMBAR SPECIAL TESTS:  Straight leg raise test: Negative   FUNCTIONAL TESTS:  5 times sit to  stand: 10.54 sec GAIT: Distance walked: 50 Assistive device utilized: None Level of assistance: Complete Independence Comments: normal heel to toe progression       TODAY'S TREATMENT  6/9: Discussion of option of DN had in past with good response decided to wait  Supine hand to opp knee isometric 5x Supine bent knee raise and lower 5x  Seated transverse abdominus draw in with isometric push Seated 5# weight chops and Vs 10x each Standing red band rows 10x Standing red band shoulder extensions 10x Standing Pallof press in standard stance 10x each way Assisted pt in logging onto [facility] Therapeutic activity for standing, walking, and sitting postural improvement       6/6  Review of supine decompression series and stretching HEP Supine shoulder press down 5x Supine leg lengthener 5x Supine whole leg press down 5x  Supine abdominal brace 5x Supine hand to opp knee isometric 5x Supine bent knee raise and lower 5x  Supine red band scapular series: overhead, horizontal abduction, sash, external rotation 5-8 reps each  Therapeutic activity for standing, walking, and sitting postural improvement        PATIENT EDUCATION:  Education details: decompression series handout (not on [facility]) Person educated: Patient Education method: Consulting civil engineer, Media planner, Verbal cues, and Handouts Education comprehension: verbalized understanding, returned demonstration, and verbal cues required     HOME EXERCISE PROGRAM: Access Code: [email or web address] URL: [email or web address] Date: 10/03/2021 Prepared by: Ruben Im  Exercises - Supine Hamstring Stretch with Strap  - 1 x daily - 7 x weekly - 1 sets - 3 reps - 30 sec hold - Supine ITB Stretch with Strap  - 1 x daily - 7 x weekly - 1 sets - 3 reps - 30 sec hold - Seated Piriformis Stretch with Trunk Bend  - 1 x daily - 7 x weekly - 1 sets - 3 reps - 30 sec hold - Hooklying Isometric Hip Flexion  - 1 x daily - 7 x weekly  - 1 sets - 5 reps - Supine Bent Leg Lift with Knee Extension  - 1 x daily - 7 x weekly - 1 sets - 5 reps - Seated Diagonal Chop with Medicine Ball  - 1 x daily - 7 x weekly - 2 sets - 10 reps - Standing Row with Anchored Resistance  - 1 x daily - 7 x weekly - 1 sets - 10 reps - Standing Shoulder Extension with Resistance  - 1 x daily - 7 x weekly - 1 sets - 10 reps - Standing Anti-Rotation Press with Anchored Resistance  - 1 x daily - 7 x weekly - 1 sets - 10 reps  Decompression ex's handout  Supine band scapular ex's     ASSESSMENT:  CLINICAL IMPRESSION:    Good carryover with previous HEP and able to add a progression of core strengthening in sitting and standing.  Able to demonstrate transverse abdominus muscle activation without holding breath.  Therapist monitoring response and providing cues for optimal benefit from ex.    OBJECTIVE IMPAIRMENTS Abnormal gait, decreased mobility, difficulty walking, decreased ROM, decreased strength, hypomobility, increased fascial restrictions, increased muscle spasms, impaired flexibility, postural dysfunction, and pain.    ACTIVITY LIMITATIONS carrying, lifting, bending, sitting, standing, squatting, sleeping, stairs, transfers, bed mobility, bathing, dressing, hygiene/grooming, and caring for others   PARTICIPATION LIMITATIONS: meal prep, cleaning, laundry, interpersonal relationship, driving, shopping, community activity, yard work, and church   PERSONAL FACTORS Age, Fitness, Time since onset of injury/illness/exacerbation, and 1-2 comorbidities: hx cancer and osteopenia  are also affecting patient's functional outcome.    REHAB POTENTIAL: Good   CLINICAL DECISION MAKING: Evolving/moderate complexity   EVALUATION COMPLEXITY: Moderate     GOALS: Goals reviewed with patient? Yes   SHORT TERM GOALS: Target date: 10/16/2021   Patient will be independent with initial HEP  Baseline: Goal status: INITIAL   2.  Pain report to be no greater  than 4/10  Baseline:  Goal status: INITIAL     LONG TERM GOALS: Target date: 11/13/2021   Patient to be independent with advanced HEP  Baseline:  Goal status: INITIAL   2.  Patient to report pain no greater than 2/10  Baseline:  Goal status: INITIAL   3.  Patient to be able to sleep through the night  Baseline:  Goal status: INITIAL   4.  Patient to report 85% improvement in overall symptoms  Baseline:  Goal status: INITIAL   5.  Patient to be able to stand or walk for at least 15 min without leg pain  Baseline:  Goal status: INITIAL       PLAN: PT FREQUENCY: 1-2x/week   PT DURATION: 8 weeks   PLANNED INTERVENTIONS: Therapeutic exercises, Therapeutic activity, Neuromuscular re-education, Balance training, Gait training, Patient/Family education, Joint mobilization, Stair training, Aquatic Therapy, Dry Needling, Electrical stimulation, Spinal mobilization, Cryotherapy, Moist heat, Taping, Traction, Ultrasound, Ionotophoresis '4mg'$ /ml Dexamethasone, Manual therapy, and Re-evaluation.   PLAN FOR NEXT SESSION: Review HEP,  band rows and extensions;  seated thoracic extension;  all single arm variations to red band scap series;  NuStep,   core strengthening,   may be open to DN in future; progress toward planks with modifications (pt used to do and liked the benefit)  Ruben Im, PT 10/03/21 12:17 PM Phone: [phone] Fax: [phone]

## 2021-10-07 ENCOUNTER — Ambulatory Visit: Payer: BC Managed Care – PPO

## 2021-10-07 DIAGNOSIS — M5459 Other low back pain: Secondary | ICD-10-CM

## 2021-10-07 DIAGNOSIS — M6281 Muscle weakness (generalized): Secondary | ICD-10-CM

## 2021-10-07 DIAGNOSIS — R252 Cramp and spasm: Secondary | ICD-10-CM | POA: Diagnosis not present

## 2021-10-07 DIAGNOSIS — R262 Difficulty in walking, not elsewhere classified: Secondary | ICD-10-CM

## 2021-10-07 DIAGNOSIS — M25551 Pain in right hip: Secondary | ICD-10-CM

## 2021-10-07 DIAGNOSIS — R293 Abnormal posture: Secondary | ICD-10-CM

## 2021-10-07 NOTE — Therapy (Signed)
OUTPATIENT PHYSICAL THERAPY TREATMENT NOTE   Patient Name: Victoria Hartman MRN: [id number] DOB:12/25/1960, 61 y.o., female Today's Date: 10/07/2021   REFERRING PROVIDER: Inez Catalina, MD   END OF SESSION:   PT End of Session - 10/07/21 1539     Visit Number 5    Date for PT Re-Evaluation 11/13/21    Authorization Type BCBS    PT Start Time 1539    PT Stop Time 1615    PT Time Calculation (min) 36 min    Activity Tolerance Patient tolerated treatment well    Behavior During Therapy WFL for tasks assessed/performed             Past Medical History:  Diagnosis Date   Cancer ([facility])    breast   Dysplastic nevus syndrome    Medical history non-contributory    Osteopenia    Yeast infection    Past Surgical History:  Procedure Laterality Date   BREAST LUMPECTOMY     GYNECOLOGIC CRYOSURGERY     MOLE REMOVAL     WISDOM TOOTH EXTRACTION     Patient Active Problem List   Diagnosis Date Noted   Sepsis ([place]) 11/13/2013   SIRS (systemic inflammatory response syndrome) ([place]) 11/13/2013   Right knee pain 11/13/2013   Sinusitis 11/13/2013   Osteopenia    Breast cancer ([place]) 01/26/2004    REFERRING DIAG: M16.11 (ICD-10-CM) - Unilateral primary osteoarthritis, right hip M54.50 (ICD-10-CM) - Low back pain, unspecified   THERAPY DIAG: Pain in right hip   Other low back pain   Abnormal posture   Cramp and spasm   Difficulty in walking, not elsewhere classified   Muscle weakness (generalized   Rationale for Evaluation and Treatment Rehabilitation  PERTINENT HISTORY: Patient reports long history of scoliosis and mild back pain but recent worsening and having right LE pain.  She also feels some weakness in her right leg when trying to ascend or descend steps.  She hopes to gain some control over her pain and be able to avoid her scoliosis getting worse.  PRECAUTIONS: osteopenia  SUBJECTIVE:  Pain level maybe a little worse.  Continued right hip concentrated and will  sometimes refer to calf/thigh.  No limp today.       PAIN:  Are you having pain? Yes NPRS scale: 5/10  OBJECTIVE: (objective measures completed at initial evaluation unless otherwise dated)  OBJECTIVE:    DIAGNOSTIC FINDINGS:  IMPRESSION: 1. Calcific density noted over the right upper quadrant. This could represent a gallstone or kidney stone.   2.  Stool noted throughout the colon.   3. Severe thoracolumbar spine scoliosis. Multilevel degenerative change thoracolumbar spine. Prominent degenerative changes both hips, most severe on the right. No acute bony abnormality.   Severe degenerative changes right hip with subchondral sclerosis and complete loss of joint space. Prominent subchondral cystic changes noted in the right femoral head and acetabulum are most likely degenerative. Lateral subluxation of the right hip noted, this is most likely degenerative. No evidence of fracture or dislocation. Pelvic calcifications consistent with phleboliths   PATIENT SURVEYS:  FOTO 42 (goal is 61)   SCREENING FOR RED FLAGS: Bowel or bladder incontinence: No Spinal tumors: No Cauda equina syndrome: No Compression fracture: No Abdominal aneurysm: No   COGNITION:           Overall cognitive status: Within functional limits for tasks assessed  SENSATION: WFL   MUSCLE LENGTH: Hamstrings: Right 50 deg; Left 60 deg Thomas test: Right pos ; Left pos    POSTURE:  left thoracic convex scoliosis   PALPATION: Soft tissue restriction bilateral thoracic and lumbar paraspinals.   LUMBAR ROM:    Active  A/PROM  eval  Flexion Fingertips to distal shin  Extension 75%  Right lateral flexion Fingertips to joint line  Left lateral flexion Fingertips to just above joint line  Right rotation WFL  Left rotation WFL   (Blank rows = not tested)   LOWER EXTREMITY ROM:      WFL   LOWER EXTREMITY MMT:     Generally 4 to 4+/5   LUMBAR SPECIAL TESTS:  Straight  leg raise test: Negative   FUNCTIONAL TESTS:  5 times sit to stand: 10.54 sec GAIT: Distance walked: 50 Assistive device utilized: None Level of assistance: Complete Independence Comments: normal heel to toe progression       TODAY'S TREATMENT  6/13: NuStep x 5 min level 5 Supine hamstring stretch 3 x 20 sec each LE IT band stretch 3 x 20 sec each LE Lengthy amount of time spent on education: educated on anatomy of the hip and lumbar spine,  suggested patient use some type of pain control such as Tylenol or ibuprofen, pulled up pictures of hip and lumbar anatomy and described synovial lining of joints and how deterioration occurs.  Importance of correct posture and alignment.  Also encouraged patient to allow [place] to focus more on the exercises vs. Needing detailed explanation for each of her symptoms as this tends to be taking away from her therapy sessions.  Explained that education and understanding her condition is very important but to maybe allow [place] to spend more time on the exercises so that she can begin to see results and avoid further intervention.      6/9: Discussion of option of DN had in past with good response decided to wait  Supine hand to opp knee isometric 5x Supine bent knee raise and lower 5x  Seated transverse abdominus draw in with isometric push Seated 5# weight chops and Vs 10x each Standing red band rows 10x Standing red band shoulder extensions 10x Standing Pallof press in standard stance 10x each way Assisted pt in logging onto Medbridge Therapeutic activity for standing, walking, and sitting postural improvement       6/6  Review of supine decompression series and stretching HEP Supine shoulder press down 5x Supine leg lengthener 5x Supine whole leg press down 5x  Supine abdominal brace 5x Supine hand to opp knee isometric 5x Supine bent knee raise and lower 5x  Supine red band scapular series: overhead, horizontal abduction, sash, external  rotation 5-8 reps each  Therapeutic activity for standing, walking, and sitting postural improvement        PATIENT EDUCATION:  Education details: decompression series handout (not on Medbridge) Person educated: Patient Education method: Consulting civil engineer, Media planner, Verbal cues, and Handouts Education comprehension: verbalized understanding, returned demonstration, and verbal cues required     HOME EXERCISE PROGRAM: Access Code: [email or web address] URL: [email or web address] Date: 10/03/2021 Prepared by: Ruben Im  Exercises - Supine Hamstring Stretch with Strap  - 1 x daily - 7 x weekly - 1 sets - 3 reps - 30 sec hold - Supine ITB Stretch with Strap  - 1 x daily - 7 x weekly - 1 sets - 3 reps - 30 sec hold - Seated Piriformis Stretch with Trunk Bend  - 1  x daily - 7 x weekly - 1 sets - 3 reps - 30 sec hold - Hooklying Isometric Hip Flexion  - 1 x daily - 7 x weekly - 1 sets - 5 reps - Supine Bent Leg Lift with Knee Extension  - 1 x daily - 7 x weekly - 1 sets - 5 reps - Seated Diagonal Chop with Medicine Ball  - 1 x daily - 7 x weekly - 2 sets - 10 reps - Standing Row with Anchored Resistance  - 1 x daily - 7 x weekly - 1 sets - 10 reps - Standing Shoulder Extension with Resistance  - 1 x daily - 7 x weekly - 1 sets - 10 reps - Standing Anti-Rotation Press with Anchored Resistance  - 1 x daily - 7 x weekly - 1 sets - 10 reps  Decompression ex's handout  Supine band scapular ex's     ASSESSMENT:   CLINICAL IMPRESSION:    Darien is showing slow but steady progress.  She does tend to need very detailed explanation of how each exercise is benefiting her on some visits which tends to limit our treatment time.  She would benefit from continued skilled PT for core stabilization and LE flexibility.     OBJECTIVE IMPAIRMENTS Abnormal gait, decreased mobility, difficulty walking, decreased ROM, decreased strength, hypomobility, increased fascial restrictions, increased muscle  spasms, impaired flexibility, postural dysfunction, and pain.    ACTIVITY LIMITATIONS carrying, lifting, bending, sitting, standing, squatting, sleeping, stairs, transfers, bed mobility, bathing, dressing, hygiene/grooming, and caring for others   PARTICIPATION LIMITATIONS: meal prep, cleaning, laundry, interpersonal relationship, driving, shopping, community activity, yard work, and church   PERSONAL FACTORS Age, Fitness, Time since onset of injury/illness/exacerbation, and 1-2 comorbidities: hx cancer and osteopenia  are also affecting patient's functional outcome.    REHAB POTENTIAL: Good   CLINICAL DECISION MAKING: Evolving/moderate complexity   EVALUATION COMPLEXITY: Moderate     GOALS: Goals reviewed with patient? Yes   SHORT TERM GOALS: Target date: 10/16/2021   Patient will be independent with initial HEP  Baseline: Goal status: INITIAL   2.  Pain report to be no greater than 4/10  Baseline:  Goal status: INITIAL     LONG TERM GOALS: Target date: 11/13/2021   Patient to be independent with advanced HEP  Baseline:  Goal status: INITIAL   2.  Patient to report pain no greater than 2/10  Baseline:  Goal status: INITIAL   3.  Patient to be able to sleep through the night  Baseline:  Goal status: INITIAL   4.  Patient to report 85% improvement in overall symptoms  Baseline:  Goal status: INITIAL   5.  Patient to be able to stand or walk for at least 15 min without leg pain  Baseline:  Goal status: INITIAL       PLAN: PT FREQUENCY: 1-2x/week   PT DURATION: 8 weeks   PLANNED INTERVENTIONS: Therapeutic exercises, Therapeutic activity, Neuromuscular re-education, Balance training, Gait training, Patient/Family education, Joint mobilization, Stair training, Aquatic Therapy, Dry Needling, Electrical stimulation, Spinal mobilization, Cryotherapy, Moist heat, Taping, Traction, Ultrasound, Ionotophoresis '4mg'$ /ml Dexamethasone, Manual therapy, and Re-evaluation.    PLAN FOR NEXT SESSION: Review HEP,  band rows and extensions;  seated thoracic extension;  all single arm variations to red band scap series;  NuStep,   core strengthening,   may be open to DN in future; progress toward planks with modifications (pt used to do and liked the benefit)  Anderson Malta B. Deana Krock,  PT 10/07/21 5:01 PM  [facility] [address], [place], [place] [address] Phone # [phone] Fax [phone]

## 2021-10-10 ENCOUNTER — Ambulatory Visit: Payer: BC Managed Care – PPO

## 2021-10-10 DIAGNOSIS — R293 Abnormal posture: Secondary | ICD-10-CM | POA: Diagnosis not present

## 2021-10-10 DIAGNOSIS — R262 Difficulty in walking, not elsewhere classified: Secondary | ICD-10-CM

## 2021-10-10 DIAGNOSIS — R252 Cramp and spasm: Secondary | ICD-10-CM

## 2021-10-10 DIAGNOSIS — M6281 Muscle weakness (generalized): Secondary | ICD-10-CM | POA: Diagnosis not present

## 2021-10-10 DIAGNOSIS — M25551 Pain in right hip: Secondary | ICD-10-CM | POA: Diagnosis not present

## 2021-10-10 DIAGNOSIS — M5459 Other low back pain: Secondary | ICD-10-CM

## 2021-10-10 NOTE — Therapy (Signed)
OUTPATIENT PHYSICAL THERAPY TREATMENT NOTE   Patient Name: Victoria Hartman MRN: [id number] DOB:Sep 11, 1960, 61 y.o., female Today's Date: 10/10/2021   REFERRING PROVIDER: Inez Catalina, MD   END OF SESSION:   PT End of Session - 10/10/21 0846     Visit Number 6    Date for PT Re-Evaluation 11/13/21    Authorization Type BCBS    PT Start Time 0847    PT Stop Time 0939    PT Time Calculation (min) 52 min    Activity Tolerance Patient tolerated treatment well    Behavior During Therapy WFL for tasks assessed/performed             Past Medical History:  Diagnosis Date   Cancer ([place])    breast   Dysplastic nevus syndrome    Medical history non-contributory    Osteopenia    Yeast infection    Past Surgical History:  Procedure Laterality Date   BREAST LUMPECTOMY     GYNECOLOGIC CRYOSURGERY     MOLE REMOVAL     WISDOM TOOTH EXTRACTION     Patient Active Problem List   Diagnosis Date Noted   Sepsis ([place]) 11/13/2013   SIRS (systemic inflammatory response syndrome) ([place]) 11/13/2013   Right knee pain 11/13/2013   Sinusitis 11/13/2013   Osteopenia    Breast cancer ([place]) 01/26/2004    REFERRING DIAG: M16.11 (ICD-10-CM) - Unilateral primary osteoarthritis, right hip M54.50 (ICD-10-CM) - Low back pain, unspecified   THERAPY DIAG: Pain in right hip   Other low back pain   Abnormal posture   Cramp and spasm   Difficulty in walking, not elsewhere classified   Muscle weakness (generalized   Rationale for Evaluation and Treatment Rehabilitation  PERTINENT HISTORY: Patient reports long history of scoliosis and mild back pain but recent worsening and having right LE pain.  She also feels some weakness in her right leg when trying to ascend or descend steps.  She hopes to gain some control over her pain and be able to avoid her scoliosis getting worse.  PRECAUTIONS: osteopenia  SUBJECTIVE:  Patient reports she is doing "ok".  She took 2 ES Tylenol this morning and  pain is 5/10.  She states she has been taking Tylenol for the last several days intermittently.     She states that it does help when she takes it, even getting the pain down to 1/10 but she does not want to depend on the Tylenol.     PAIN:  Are you having pain? Yes NPRS scale: 5/10  OBJECTIVE: (objective measures completed at initial evaluation unless otherwise dated)  OBJECTIVE:    DIAGNOSTIC FINDINGS:  IMPRESSION: 1. Calcific density noted over the right upper quadrant. This could represent a gallstone or kidney stone.   2.  Stool noted throughout the colon.   3. Severe thoracolumbar spine scoliosis. Multilevel degenerative change thoracolumbar spine. Prominent degenerative changes both hips, most severe on the right. No acute bony abnormality.   Severe degenerative changes right hip with subchondral sclerosis and complete loss of joint space. Prominent subchondral cystic changes noted in the right femoral head and acetabulum are most likely degenerative. Lateral subluxation of the right hip noted, this is most likely degenerative. No evidence of fracture or dislocation. Pelvic calcifications consistent with phleboliths   PATIENT SURVEYS:  FOTO 42 (goal is 61)   SCREENING FOR RED FLAGS: Bowel or bladder incontinence: No Spinal tumors: No Cauda equina syndrome: No Compression fracture: No Abdominal aneurysm: No   COGNITION:  Overall cognitive status: Within functional limits for tasks assessed                          SENSATION: WFL   MUSCLE LENGTH: Hamstrings: Right 50 deg; Left 60 deg Thomas test: Right pos ; Left pos    POSTURE:  left thoracic convex scoliosis   PALPATION: Soft tissue restriction bilateral thoracic and lumbar paraspinals.   LUMBAR ROM:    Active  A/PROM  eval  Flexion Fingertips to distal shin  Extension 75%  Right lateral flexion Fingertips to joint line  Left lateral flexion Fingertips to just above joint line  Right  rotation WFL  Left rotation WFL   (Blank rows = not tested)   LOWER EXTREMITY ROM:      WFL   LOWER EXTREMITY MMT:     Generally 4 to 4+/5   LUMBAR SPECIAL TESTS:  Straight leg raise test: Negative   FUNCTIONAL TESTS:  5 times sit to stand: 10.54 sec GAIT: Distance walked: 50 Assistive device utilized: None Level of assistance: Complete Independence Comments: normal heel to toe progression       TODAY'S TREATMENT  6/16: NuStep x 5 min level 5 Supine hamstring stretch 3 x 20 sec each LE IT band stretch 3 x 20 sec each LE PPT  2 x 10 PPT with dying bug  x 20 4 way hip with 2 lb 2 x 10 Hooklying clam with green loop 2 x 10 Side lying clam right hip 2 x 10 green loop Hip adductor stretch with strap 3 x 20 sec right  Butterfly stretch Ice to lumbar spine in hook lying x 10 min  6/13: NuStep x 5 min level 5 Supine hamstring stretch 3 x 20 sec each LE IT band stretch 3 x 20 sec each LE Lengthy amount of time spent on education: educated on anatomy of the hip and lumbar spine,  suggested patient use some type of pain control such as Tylenol or ibuprofen, pulled up pictures of hip and lumbar anatomy and described synovial lining of joints and how deterioration occurs.  Importance of correct posture and alignment.  Also encouraged patient to allow [place] to focus more on the exercises vs. Needing detailed explanation for each of her symptoms as this tends to be taking away from her therapy sessions.  Explained that education and understanding her condition is very important but to maybe allow [place] to spend more time on the exercises so that she can begin to see results and avoid further intervention.      6/9: Discussion of option of DN had in past with good response decided to wait  Supine hand to opp knee isometric 5x Supine bent knee raise and lower 5x  Seated transverse abdominus draw in with isometric push Seated 5# weight chops and Vs 10x each Standing red band rows  10x Standing red band shoulder extensions 10x Standing Pallof press in standard stance 10x each way Assisted pt in logging onto [employer] Therapeutic activity for standing, walking, and sitting postural improvement      PATIENT EDUCATION:  Education details: decompression series handout (not on Medbridge) Person educated: Patient Education method: Consulting civil engineer, Demonstration, Verbal cues, and Handouts Education comprehension: verbalized understanding, returned demonstration, and verbal cues required     HOME EXERCISE PROGRAM: Access Code: [email or web address] URL: [email or web address] Date: 10/10/2021 Prepared by: Candyce Churn  Exercises - Supine Hamstring Stretch with Strap  - 1 x daily - 7 x weekly -  1 sets - 3 reps - 30 sec hold - Supine ITB Stretch with Strap  - 1 x daily - 7 x weekly - 1 sets - 3 reps - 30 sec hold - Hip Adductors and Hamstring Stretch with Strap  - 1 x daily - 7 x weekly - 1 sets - 3 reps - 30 sec hold - Hooklying Isometric Hip Flexion  - 1 x daily - 7 x weekly - 1 sets - 5 reps - Supine Bent Leg Lift with Knee Extension  - 1 x daily - 7 x weekly - 1 sets - 5 reps - Butterfly Groin Stretch  - 1 x daily - 7 x weekly - 1 sets - 3 reps - 30 sec hold - Seated Piriformis Stretch with Trunk Bend  - 1 x daily - 7 x weekly - 1 sets - 3 reps - 30 sec hold - Seated Diagonal Chop with Medicine Ball  - 1 x daily - 7 x weekly - 2 sets - 10 reps - Standing Row with Anchored Resistance  - 1 x daily - 7 x weekly - 1 sets - 10 reps - Standing Shoulder Extension with Resistance  - 1 x daily - 7 x weekly - 1 sets - 10 reps - Standing Anti-Rotation Press with Anchored Resistance  - 1 x daily - 7 x weekly - 1 sets - 10 reps   ASSESSMENT:   CLINICAL IMPRESSION:    [place] was able to tolerate all tasks today. She was more focused on her exercises vs. Asking multiple questions.  She had some difficulty with butterfly stretch but was able to get into position with verbal cues.   She responded well to ice at end of session and was encouraged to obtain ice pack for home.   She would benefit from continued skilled PT for core stabilization and LE flexibility.     OBJECTIVE IMPAIRMENTS Abnormal gait, decreased mobility, difficulty walking, decreased ROM, decreased strength, hypomobility, increased fascial restrictions, increased muscle spasms, impaired flexibility, postural dysfunction, and pain.    ACTIVITY LIMITATIONS carrying, lifting, bending, sitting, standing, squatting, sleeping, stairs, transfers, bed mobility, bathing, dressing, hygiene/grooming, and caring for others   PARTICIPATION LIMITATIONS: meal prep, cleaning, laundry, interpersonal relationship, driving, shopping, community activity, yard work, and church   PERSONAL FACTORS Age, Fitness, Time since onset of injury/illness/exacerbation, and 1-2 comorbidities: hx cancer and osteopenia  are also affecting patient's functional outcome.    REHAB POTENTIAL: Good   CLINICAL DECISION MAKING: Evolving/moderate complexity   EVALUATION COMPLEXITY: Moderate     GOALS: Goals reviewed with patient? Yes   SHORT TERM GOALS: Target date: 10/16/2021   Patient will be independent with initial HEP  Baseline: Goal status: INITIAL   2.  Pain report to be no greater than 4/10  Baseline:  Goal status: INITIAL     LONG TERM GOALS: Target date: 11/13/2021   Patient to be independent with advanced HEP  Baseline:  Goal status: INITIAL   2.  Patient to report pain no greater than 2/10  Baseline:  Goal status: INITIAL   3.  Patient to be able to sleep through the night  Baseline:  Goal status: INITIAL   4.  Patient to report 85% improvement in overall symptoms  Baseline:  Goal status: INITIAL   5.  Patient to be able to stand or walk for at least 15 min without leg pain  Baseline:  Goal status: INITIAL       PLAN: PT FREQUENCY: 1-2x/week  PT DURATION: 8 weeks   PLANNED INTERVENTIONS: Therapeutic  exercises, Therapeutic activity, Neuromuscular re-education, Balance training, Gait training, Patient/Family education, Joint mobilization, Stair training, Aquatic Therapy, Dry Needling, Electrical stimulation, Spinal mobilization, Cryotherapy, Moist heat, Taping, Traction, Ultrasound, Ionotophoresis '4mg'$ /ml Dexamethasone, Manual therapy, and Re-evaluation.   PLAN FOR NEXT SESSION: Review HEP,  band rows and extensions;  seated thoracic extension;  all single arm variations to red band scap series;  NuStep,   core strengthening,   may be open to DN in future; progress toward planks with modifications (pt used to do and liked the benefit)  Anderson Malta B. Amiliah Campisi, PT 10/10/21 9:51 AM  [facility] [address] [place], [place] [address] Phone # [phone] Fax [phone]

## 2021-10-14 ENCOUNTER — Ambulatory Visit: Payer: BC Managed Care – PPO

## 2021-10-14 DIAGNOSIS — R262 Difficulty in walking, not elsewhere classified: Secondary | ICD-10-CM | POA: Diagnosis not present

## 2021-10-14 DIAGNOSIS — M6281 Muscle weakness (generalized): Secondary | ICD-10-CM | POA: Diagnosis not present

## 2021-10-14 DIAGNOSIS — M5459 Other low back pain: Secondary | ICD-10-CM

## 2021-10-14 DIAGNOSIS — M25551 Pain in right hip: Secondary | ICD-10-CM | POA: Diagnosis not present

## 2021-10-14 DIAGNOSIS — R252 Cramp and spasm: Secondary | ICD-10-CM | POA: Diagnosis not present

## 2021-10-14 DIAGNOSIS — R293 Abnormal posture: Secondary | ICD-10-CM

## 2021-10-14 NOTE — Therapy (Signed)
OUTPATIENT PHYSICAL THERAPY TREATMENT NOTE   Patient Name: Victoria Hartman MRN: [id number] DOB:1961-04-12, 61 y.o., female Today's Date: 10/14/2021   REFERRING PROVIDER: Inez Catalina, MD   END OF SESSION:   PT End of Session - 10/14/21 0805     Visit Number 7    Date for PT Re-Evaluation 11/13/21    Authorization Type BCBS    PT Start Time 0802    PT Stop Time 0845    PT Time Calculation (min) 43 min    Activity Tolerance Patient tolerated treatment well    Behavior During Therapy WFL for tasks assessed/performed             Past Medical History:  Diagnosis Date   Cancer ([place])    breast   Dysplastic nevus syndrome    Medical history non-contributory    Osteopenia    Yeast infection    Past Surgical History:  Procedure Laterality Date   BREAST LUMPECTOMY     GYNECOLOGIC CRYOSURGERY     MOLE REMOVAL     WISDOM TOOTH EXTRACTION     Patient Active Problem List   Diagnosis Date Noted   Sepsis ([place]) 11/13/2013   SIRS (systemic inflammatory response syndrome) ([place]) 11/13/2013   Right knee pain 11/13/2013   Sinusitis 11/13/2013   Osteopenia    Breast cancer ([place]) 01/26/2004    REFERRING DIAG: M16.11 (ICD-10-CM) - Unilateral primary osteoarthritis, right hip M54.50 (ICD-10-CM) - Low back pain, unspecified   THERAPY DIAG: Pain in right hip   Other low back pain   Abnormal posture   Cramp and spasm   Difficulty in walking, not elsewhere classified   Muscle weakness (generalized   Rationale for Evaluation and Treatment Rehabilitation  PERTINENT HISTORY: Patient reports long history of scoliosis and mild back pain but recent worsening and having right LE pain.  She also feels some weakness in her right leg when trying to ascend or descend steps.  She hopes to gain some control over her pain and be able to avoid her scoliosis getting worse.  PRECAUTIONS: osteopenia  SUBJECTIVE:  Patient reports she went out of town and it was a trip to [place] so it  wasn't that far and she had a pretty rough weekend.  She took ice with her and did her stretches in the hotel room and used Tylenol and Ibuprofen mainly.  She states that yesterday, she felt a lot better and today her pain is a 3.5/10.  She is concerned about an upcoming trip to [place].  However, she also mentioned that she was also doing her exercises in the floor which may have also contributed to her pain.  Instructed patient to do exercises on the bed.   PAIN:  Are you having pain? Yes NPRS scale: 5/10  OBJECTIVE: (objective measures completed at initial evaluation unless otherwise dated)  OBJECTIVE:    DIAGNOSTIC FINDINGS:  IMPRESSION: 1. Calcific density noted over the right upper quadrant. This could represent a gallstone or kidney stone.   2.  Stool noted throughout the colon.   3. Severe thoracolumbar spine scoliosis. Multilevel degenerative change thoracolumbar spine. Prominent degenerative changes both hips, most severe on the right. No acute bony abnormality.   Severe degenerative changes right hip with subchondral sclerosis and complete loss of joint space. Prominent subchondral cystic changes noted in the right femoral head and acetabulum are most likely degenerative. Lateral subluxation of the right hip noted, this is most likely degenerative. No evidence of fracture or dislocation. Pelvic calcifications  consistent with phleboliths   PATIENT SURVEYS:  FOTO 42 (goal is 61)   SCREENING FOR RED FLAGS: Bowel or bladder incontinence: No Spinal tumors: No Cauda equina syndrome: No Compression fracture: No Abdominal aneurysm: No   COGNITION:           Overall cognitive status: Within functional limits for tasks assessed                          SENSATION: WFL   MUSCLE LENGTH: Hamstrings: Right 50 deg; Left 60 deg Thomas test: Right pos ; Left pos    POSTURE:  left thoracic convex scoliosis   PALPATION: Soft tissue restriction bilateral thoracic and  lumbar paraspinals.   LUMBAR ROM:    Active  A/PROM  eval  Flexion Fingertips to distal shin  Extension 75%  Right lateral flexion Fingertips to joint line  Left lateral flexion Fingertips to just above joint line  Right rotation WFL  Left rotation WFL   (Blank rows = not tested)   LOWER EXTREMITY ROM:      WFL   LOWER EXTREMITY MMT:     Generally 4 to 4+/5   LUMBAR SPECIAL TESTS:  Straight leg raise test: Negative   FUNCTIONAL TESTS:  5 times sit to stand: 10.54 sec GAIT: Distance walked: 50 Assistive device utilized: None Level of assistance: Complete Independence Comments: normal heel to toe progression       TODAY'S TREATMENT  6/20: NuStep x 5 min level 5 Scoliosis stretch over peanut roll to left 10 x 10 sec Sidelying over peanut roll on right side / left side trunk strengthening 2 x 10  Supine hamstring stretch 3 x 20 sec each LE IT band stretch 3 x 20 sec each LE Hip adductor stretch with strap 3 x 20 sec right  PPT  2 x 10 PPT with dying bug  x 20 4 way hip with 2.5 lb 2 x 10 Hooklying clam with blue loop 2 x 10 Side lying clam right hip 2 x 10 blue loop Supine piriformis stretch and hip IR's stretch 3 x 20 sec each (added to HEP) Ice to lumbar spine in hook lying x 10 min  6/16: NuStep x 5 min level 5 Supine hamstring stretch 3 x 20 sec each LE IT band stretch 3 x 20 sec each LE PPT  2 x 10 PPT with dying bug  x 20 4 way hip with 2 lb 2 x 10 Hooklying clam with green loop 2 x 10 Side lying clam right hip 2 x 10 green loop Hip adductor stretch with strap 3 x 20 sec right  Butterfly stretch Ice to lumbar spine in hook lying x 10 min  6/13: NuStep x 5 min level 5 Supine hamstring stretch 3 x 20 sec each LE IT band stretch 3 x 20 sec each LE Lengthy amount of time spent on education: educated on anatomy of the hip and lumbar spine,  suggested patient use some type of pain control such as Tylenol or ibuprofen, pulled up pictures of hip and  lumbar anatomy and described synovial lining of joints and how deterioration occurs.  Importance of correct posture and alignment.  Also encouraged patient to allow [place] to focus more on the exercises vs. Needing detailed explanation for each of her symptoms as this tends to be taking away from her therapy sessions.  Explained that education and understanding her condition is very important but to maybe allow [place]  to spend more time on the exercises so that she can begin to see results and avoid further intervention.       PATIENT EDUCATION:  Education details: decompression series handout (not on [place]) Person educated: Patient Education method: Consulting civil engineer, Demonstration, Verbal cues, and Handouts Education comprehension: verbalized understanding, returned demonstration, and verbal cues required     HOME EXERCISE PROGRAM:  Access Code: [email or web address] URL: [email or web address] Date: 10/14/2021 Prepared by: Candyce Churn  Exercises - Supine Hamstring Stretch with Strap  - 1 x daily - 7 x weekly - 1 sets - 3 reps - 30 sec hold - Supine ITB Stretch with Strap  - 1 x daily - 7 x weekly - 1 sets - 3 reps - 30 sec hold - Hip Adductors and Hamstring Stretch with Strap  - 1 x daily - 7 x weekly - 1 sets - 3 reps - 30 sec hold - Hooklying Isometric Hip Flexion  - 1 x daily - 7 x weekly - 1 sets - 5 reps - Supine Bent Leg Lift with Knee Extension  - 1 x daily - 7 x weekly - 1 sets - 5 reps - Butterfly Groin Stretch  - 1 x daily - 7 x weekly - 1 sets - 3 reps - 30 sec hold - Seated Piriformis Stretch with Trunk Bend  - 1 x daily - 7 x weekly - 1 sets - 3 reps - 30 sec hold - Seated Diagonal Chop with Medicine Ball  - 1 x daily - 7 x weekly - 2 sets - 10 reps - Standing Row with Anchored Resistance  - 1 x daily - 7 x weekly - 1 sets - 10 reps - Standing Shoulder Extension with Resistance  - 1 x daily - 7 x weekly - 1 sets - 10 reps - Standing Anti-Rotation Press with Anchored Resistance   - 1 x daily - 7 x weekly - 1 sets - 10 reps - Supine Figure 4 Piriformis Stretch  - 1 x daily - 7 x weekly - 1 sets - 3 reps - 30 sec hold - Supine Piriformis Stretch Pulling Heel to Hip  - 1 x daily - 7 x weekly - 1 sets - 3 reps - 30 sec hold  ASSESSMENT:   CLINICAL IMPRESSION:    Hailee was able to tolerate trunk stretches and side lying strengthening .  She was able to tolerate increased resistance on 4 way hip and with clamshell in hook lying and side lying.  We added piriformis stretch for both hips to further loosen up her right hip restriction.  She is more focused on her exercises and is using ice and controlling her pain better with Tylenol and Ibuprofen. She is very compliant and well motivated.   She would benefit from continued skilled PT for core stabilization and LE flexibility.     OBJECTIVE IMPAIRMENTS Abnormal gait, decreased mobility, difficulty walking, decreased ROM, decreased strength, hypomobility, increased fascial restrictions, increased muscle spasms, impaired flexibility, postural dysfunction, and pain.    ACTIVITY LIMITATIONS carrying, lifting, bending, sitting, standing, squatting, sleeping, stairs, transfers, bed mobility, bathing, dressing, hygiene/grooming, and caring for others   PARTICIPATION LIMITATIONS: meal prep, cleaning, laundry, interpersonal relationship, driving, shopping, community activity, yard work, and church   PERSONAL FACTORS Age, Fitness, Time since onset of injury/illness/exacerbation, and 1-2 comorbidities: hx cancer and osteopenia  are also affecting patient's functional outcome.    REHAB POTENTIAL: Good   CLINICAL DECISION MAKING: Evolving/moderate complexity   EVALUATION COMPLEXITY: Moderate  GOALS: Goals reviewed with patient? Yes   SHORT TERM GOALS: Target date: 10/16/2021   Patient will be independent with initial HEP  Baseline: Goal status: Met   2.  Pain report to be no greater than 4/10  Baseline:  Goal status: Met      LONG TERM GOALS: Target date: 11/13/2021   Patient to be independent with advanced HEP  Baseline:  Goal status: INITIAL   2.  Patient to report pain no greater than 2/10  Baseline:  Goal status: INITIAL   3.  Patient to be able to sleep through the night  Baseline:  Goal status: INITIAL   4.  Patient to report 85% improvement in overall symptoms  Baseline:  Goal status: INITIAL   5.  Patient to be able to stand or walk for at least 15 min without leg pain  Baseline:  Goal status: INITIAL       PLAN: PT FREQUENCY: 1-2x/week   PT DURATION: 8 weeks   PLANNED INTERVENTIONS: Therapeutic exercises, Therapeutic activity, Neuromuscular re-education, Balance training, Gait training, Patient/Family education, Joint mobilization, Stair training, Aquatic Therapy, Dry Needling, Electrical stimulation, Spinal mobilization, Cryotherapy, Moist heat, Taping, Traction, Ultrasound, Ionotophoresis 49m/ml Dexamethasone, Manual therapy, and Re-evaluation.   PLAN FOR NEXT SESSION: Review HEP,  band rows and extensions;  seated thoracic extension;  all single arm variations to red band scap series;  NuStep,   core strengthening,   may be open to DN in future; progress toward planks with modifications (pt used to do and liked the benefit)  JAnderson MaltaB. Cledith Abdou, PT 10/14/21 8:53 AM  [address] [address] SMoorefieldGPea [place] [address]Phone # [phone] [phone]

## 2021-10-24 ENCOUNTER — Ambulatory Visit: Payer: BC Managed Care – PPO | Admitting: Physical Therapy

## 2021-10-24 DIAGNOSIS — R252 Cramp and spasm: Secondary | ICD-10-CM | POA: Diagnosis not present

## 2021-10-24 DIAGNOSIS — M6281 Muscle weakness (generalized): Secondary | ICD-10-CM | POA: Diagnosis not present

## 2021-10-24 DIAGNOSIS — M25551 Pain in right hip: Secondary | ICD-10-CM

## 2021-10-24 DIAGNOSIS — M5459 Other low back pain: Secondary | ICD-10-CM | POA: Diagnosis not present

## 2021-10-24 DIAGNOSIS — R262 Difficulty in walking, not elsewhere classified: Secondary | ICD-10-CM | POA: Diagnosis not present

## 2021-10-24 DIAGNOSIS — R293 Abnormal posture: Secondary | ICD-10-CM | POA: Diagnosis not present

## 2021-10-24 NOTE — Therapy (Signed)
OUTPATIENT PHYSICAL THERAPY TREATMENT NOTE   Patient Name: Victoria Hartman MRN: [id number] DOB:May 23, 1960, 61 y.o.,, female Today's Date: 10/24/2021   REFERRING PROVIDER: Inez Catalina, MD   END OF SESSION:   PT End of Session - 10/24/21 0845     Visit Number 8    Date for PT Re-Evaluation 11/13/21    Authorization Type BCBS    PT Start Time 0845    PT Stop Time 0925    PT Time Calculation (min) 40 min    Activity Tolerance Patient tolerated treatment well             Past Medical History:  Diagnosis Date   Cancer ([place])    breast   Dysplastic nevus syndrome    Medical history non-contributory    Osteopenia    Yeast infection    Past Surgical History:  Procedure Laterality Date   BREAST LUMPECTOMY     GYNECOLOGIC CRYOSURGERY     MOLE REMOVAL     WISDOM TOOTH EXTRACTION     Patient Active Problem List   Diagnosis Date Noted   Sepsis ([place]) 11/13/2013   SIRS (systemic inflammatory response syndrome) ([place]) 11/13/2013   Right knee pain 11/13/2013   Sinusitis 11/13/2013   Osteopenia    Breast cancer ([place]) 01/26/2004    REFERRING DIAG: M16.11 (ICD-10-CM) - Unilateral primary osteoarthritis, right hip M54.50 (ICD-10-CM) - Low back pain, unspecified   THERAPY DIAG: Pain in right hip   Other low back pain   Abnormal posture   Cramp and spasm   Difficulty in walking, not elsewhere classified   Muscle weakness (generalized   Rationale for Evaluation and Treatment Rehabilitation  PERTINENT HISTORY: Patient reports long history of scoliosis and mild back pain but recent worsening and having right LE pain.  She also feels some weakness in her right leg when trying to ascend or descend steps.  She hopes to gain some control over her pain and be able to avoid her scoliosis getting worse.  PRECAUTIONS: osteopenia  SUBJECTIVE:  Got back from trip to [place].   Walked 5 hours at [place] and at the aquarium in [place].  I used an ice pack in the car.      PAIN:  Are you having pain? Yes NPRS scale: 5/10  OBJECTIVE: (objective measures completed at initial evaluation unless otherwise dated)  OBJECTIVE:    DIAGNOSTIC FINDINGS:  IMPRESSION: 1. Calcific density noted over the right upper quadrant. This could represent a gallstone or kidney stone.   2.  Stool noted throughout the colon.   3. Severe thoracolumbar spine scoliosis. Multilevel degenerative change thoracolumbar spine. Prominent degenerative changes both hips, most severe on the right. No acute bony abnormality.   Severe degenerative changes right hip with subchondral sclerosis and complete loss of joint space. Prominent subchondral cystic changes noted in the right femoral head and acetabulum are most likely degenerative. Lateral subluxation of the right hip noted, this is most likely degenerative. No evidence of fracture or dislocation. Pelvic calcifications consistent with phleboliths   PATIENT SURVEYS:  FOTO 42 (goal is 61)   SCREENING FOR RED FLAGS: Bowel or bladder incontinence: No Spinal tumors: No Cauda equina syndrome: No Compression fracture: No Abdominal aneurysm: No   COGNITION:           Overall cognitive status: Within functional limits for tasks assessed                          SENSATION:  WFL   MUSCLE LENGTH: Hamstrings: Right 50 deg; Left 60 deg Thomas test: Right pos ; Left pos    POSTURE:  left thoracic convex scoliosis   PALPATION: Soft tissue restriction bilateral thoracic and lumbar paraspinals.   LUMBAR ROM:    Active  A/PROM  eval  Flexion Fingertips to distal shin  Extension 75%  Right lateral flexion Fingertips to joint line  Left lateral flexion Fingertips to just above joint line  Right rotation WFL  Left rotation WFL   (Blank rows = not tested)   LOWER EXTREMITY ROM:      WFL   LOWER EXTREMITY MMT:     Generally 4 to 4+/5   LUMBAR SPECIAL TESTS:  Straight leg raise test: Negative   FUNCTIONAL TESTS:  5  times sit to stand: 10.54 sec GAIT: Distance walked: 50 Assistive device utilized: None Level of assistance: Complete Independence Comments: normal heel to toe progression       TODAY'S TREATMENT   6/30: NuStep x 5 min level 5 Scoliosis stretch over peanut roll to left 10 x 10 sec PPT with dying bug  x 20 Hooklying clam with green band 2 x 10 Side lying clam green band right and left  hip  x 10  Supine piriformis stretch and hip IR's stretch 3 x 20 sec each  Standing red band around ankles hip abduction 10x each side Standing red band hip extension 10x each side  Ice to lumbar spine in hook lying x 10 min      6/20: NuStep x 5 min level 5 Scoliosis stretch over peanut roll to left 10 x 10 sec Sidelying over peanut roll on right side / left side trunk strengthening 2 x 10  Supine hamstring stretch 3 x 20 sec each LE IT band stretch 3 x 20 sec each LE Hip adductor stretch with strap 3 x 20 sec right  PPT  2 x 10 PPT with dying bug  x 20 4 way hip with 2.5 lb 2 x 10 Hooklying clam with blue loop 2 x 10 Side lying clam right hip 2 x 10 blue loop Supine piriformis stretch and hip IR's stretch 3 x 20 sec each (added to HEP) Ice to lumbar spine in hook lying x 10 min  6/16: NuStep x 5 min level 5 Supine hamstring stretch 3 x 20 sec each LE IT band stretch 3 x 20 sec each LE PPT  2 x 10 PPT with dying bug  x 20 4 way hip with 2 lb 2 x 10 Hooklying clam with green loop 2 x 10 Side lying clam right hip 2 x 10 green loop Hip adductor stretch with strap 3 x 20 sec right  Butterfly stretch Ice to lumbar spine in hook lying x 10 min   PATIENT EDUCATION:  Education details: decompression series handout (not on Medbridge) Person educated: Patient Education method: Consulting civil engineer, Media planner, Verbal cues, and Handouts Education comprehension: verbalized understanding, returned demonstration, and verbal cues required     HOME EXERCISE PROGRAM: Access Code: [email or web address] URL:  [email or web address] Date: 10/24/2021 Prepared by: Ruben Im  Exercises - Supine Hamstring Stretch with Strap  - 1 x daily - 7 x weekly - 1 sets - 3 reps - 30 sec hold - Supine ITB Stretch with Strap  - 1 x daily - 7 x weekly - 1 sets - 3 reps - 30 sec hold - Hip Adductors and Hamstring Stretch with Strap  - 1 x daily - 7 x  weekly - 1 sets - 3 reps - 30 sec hold - Hooklying Isometric Hip Flexion  - 1 x daily - 7 x weekly - 1 sets - 5 reps - Supine Bent Leg Lift with Knee Extension  - 1 x daily - 7 x weekly - 1 sets - 5 reps - Butterfly Groin Stretch  - 1 x daily - 7 x weekly - 1 sets - 3 reps - 30 sec hold - Seated Piriformis Stretch with Trunk Bend  - 1 x daily - 7 x weekly - 1 sets - 3 reps - 30 sec hold - Seated Diagonal Chop with Medicine Ball  - 1 x daily - 7 x weekly - 2 sets - 10 reps - Standing Row with Anchored Resistance  - 1 x daily - 7 x weekly - 1 sets - 10 reps - Standing Shoulder Extension with Resistance  - 1 x daily - 7 x weekly - 1 sets - 10 reps - Standing Anti-Rotation Press with Anchored Resistance  - 1 x daily - 7 x weekly - 1 sets - 10 reps - Supine Figure 4 Piriformis Stretch  - 1 x daily - 7 x weekly - 1 sets - 3 reps - 30 sec hold - Supine Piriformis Stretch Pulling Heel to Hip  - 1 x daily - 7 x weekly - 1 sets - 3 reps - 30 sec hold - Supine Dead Bug with Leg Extension  - 1 x daily - 7 x weekly - 1 sets - 10 reps - Clamshell with Resistance  - 1 x daily - 7 x weekly - 1 sets - 10 reps - Standing Hip Abduction with Resistance at Ankles and Counter Support  - 1 x daily - 7 x weekly - 1 sets - 10 reps - Standing Hip Extension with Resistance at Ankles and Counter Support  - 1 x daily - 7 x weekly - 1 sets - 10 reps  Access Code: [email or web address] ASSESSMENT:   CLINICAL IMPRESSION:    The patient was able to walk extensively on several days during her recent trip and overall her pain was well managed. Demonstrates good technique with dying bug ex and  sidelying clams so added to HEP.  Added standing hip band ex's as well to HEP but may need further review.     OBJECTIVE IMPAIRMENTS Abnormal gait, decreased mobility, difficulty walking, decreased ROM, decreased strength, hypomobility, increased fascial restrictions, increased muscle spasms, impaired flexibility, postural dysfunction, and pain.    ACTIVITY LIMITATIONS carrying, lifting, bending, sitting, standing, squatting, sleeping, stairs, transfers, bed mobility, bathing, dressing, hygiene/grooming, and caring for others   PARTICIPATION LIMITATIONS: meal prep, cleaning, laundry, interpersonal relationship, driving, shopping, community activity, yard work, and church   PERSONAL FACTORS Age, Fitness, Time since onset of injury/illness/exacerbation, and 1-2 comorbidities: hx cancer and osteopenia  are also affecting patient's functional outcome.    REHAB POTENTIAL: Good   CLINICAL DECISION MAKING: Evolving/moderate complexity   EVALUATION COMPLEXITY: Moderate     GOALS: Goals reviewed with patient? Yes   SHORT TERM GOALS: Target date: 10/16/2021   Patient will be independent with initial HEP  Baseline: Goal status: Met   2.  Pain report to be no greater than 4/10  Baseline:  Goal status: Met     LONG TERM GOALS: Target date: 11/13/2021   Patient to be independent with advanced HEP  Baseline:  Goal status: INITIAL   2.  Patient to report pain no greater than 2/10  Baseline:  Goal status: INITIAL   3.  Patient to be able to sleep through the night  Baseline:  Goal status: INITIAL   4.  Patient to report 85% improvement in overall symptoms  Baseline:  Goal status: INITIAL   5.  Patient to be able to stand or walk for at least 15 min without leg pain  Baseline:  Goal status: INITIAL       PLAN: PT FREQUENCY: 1-2x/week   PT DURATION: 8 weeks   PLANNED INTERVENTIONS: Therapeutic exercises, Therapeutic activity, Neuromuscular re-education, Balance training, Gait  training, Patient/Family education, Joint mobilization, Stair training, Aquatic Therapy, Dry Needling, Electrical stimulation, Spinal mobilization, Cryotherapy, Moist heat, Taping, Traction, Ultrasound, Ionotophoresis 24m/ml Dexamethasone, Manual therapy, and Re-evaluation.   PLAN FOR NEXT SESSION: Review HEP, review standing hip red band abduction and extension ex's;   band rows and extensions;  seated thoracic extension;  all single arm variations to red band scap series;  NuStep,   core strengthening,   may be open to DN in future; progress toward planks with modifications (pt used to do and liked the benefit)   SRuben Im PT 10/24/21 12:08 PM Phone: [phone]: [phone] [place] [place] [address]Phone # [phone] [phone]

## 2021-10-31 ENCOUNTER — Ambulatory Visit: Payer: BC Managed Care – PPO | Attending: Sports Medicine | Admitting: Physical Therapy

## 2021-10-31 DIAGNOSIS — M6281 Muscle weakness (generalized): Secondary | ICD-10-CM | POA: Insufficient documentation

## 2021-10-31 DIAGNOSIS — R262 Difficulty in walking, not elsewhere classified: Secondary | ICD-10-CM | POA: Diagnosis not present

## 2021-10-31 DIAGNOSIS — M5459 Other low back pain: Secondary | ICD-10-CM | POA: Diagnosis not present

## 2021-10-31 DIAGNOSIS — M25551 Pain in right hip: Secondary | ICD-10-CM | POA: Insufficient documentation

## 2021-10-31 DIAGNOSIS — R252 Cramp and spasm: Secondary | ICD-10-CM | POA: Insufficient documentation

## 2021-10-31 DIAGNOSIS — R293 Abnormal posture: Secondary | ICD-10-CM | POA: Insufficient documentation

## 2021-10-31 NOTE — Patient Instructions (Signed)

## 2021-10-31 NOTE — Therapy (Signed)
OUTPATIENT PHYSICAL THERAPY TREATMENT NOTE   Patient Name: Victoria Hartman MRN: [id number] DOB:[birth year], 61 y.o., female Today's Date: 10/31/2021   REFERRING PROVIDER: Inez Catalina, MD   END OF SESSION:   PT End of Session - 10/31/21 0757     Visit Number 9    Date for PT Re-Evaluation 11/13/21    Authorization Type BCBS    PT Start Time 0758    PT Stop Time 0838    PT Time Calculation (min) 40 min    Activity Tolerance Patient tolerated treatment well             Past Medical History:  Diagnosis Date   Cancer ([place])    breast   Dysplastic nevus syndrome    Medical history non-contributory    Osteopenia    Yeast infection    Past Surgical History:  Procedure Laterality Date   BREAST LUMPECTOMY     GYNECOLOGIC CRYOSURGERY     MOLE REMOVAL     WISDOM TOOTH EXTRACTION     Patient Active Problem List   Diagnosis Date Noted   Sepsis ([place]) 11/13/2013   SIRS (systemic inflammatory response syndrome) ([place]) 11/13/2013   Right knee pain 11/13/2013   Sinusitis 11/13/2013   Osteopenia    Breast cancer ([place]) 01/26/2004    REFERRING DIAG: M16.11 (ICD-10-CM) - Unilateral primary osteoarthritis, right hip M54.50 (ICD-10-CM) - Low back pain, unspecified   THERAPY DIAG: Pain in right hip   Other low back pain   Abnormal posture   Cramp and spasm   Difficulty in walking, not elsewhere classified   Muscle weakness (generalized   Rationale for Evaluation and Treatment Rehabilitation  PERTINENT HISTORY: Patient reports long history of scoliosis and mild back pain but recent worsening and having right LE pain.  She also feels some weakness in her right leg when trying to ascend or descend steps.  She hopes to gain some control over her pain and be able to avoid her scoliosis getting worse.  PRECAUTIONS: osteopenia  SUBJECTIVE:  I had a bad night last night.  I'm interested in trying the DN.  I sat in [place] game on hard bleacher for 3 1/2 hours.  I think the  band hip ex's will help.    PAIN:     Are you having pain? Yes  right buttock pain (no low back pain) NPRS scale: 5/10  OBJECTIVE: (objective measures completed at initial evaluation unless otherwise dated)  OBJECTIVE:    DIAGNOSTIC FINDINGS:  IMPRESSION: 1. Calcific density noted over the right upper quadrant. This could represent a gallstone or kidney stone.   2.  Stool noted throughout the colon.   3. Severe thoracolumbar spine scoliosis. Multilevel degenerative change thoracolumbar spine. Prominent degenerative changes both hips, most severe on the right. No acute bony abnormality.   Severe degenerative changes right hip with subchondral sclerosis and complete loss of joint space. Prominent subchondral cystic changes noted in the right femoral head and acetabulum are most likely degenerative. Lateral subluxation of the right hip noted, this is most likely degenerative. No evidence of fracture or dislocation. Pelvic calcifications consistent with phleboliths   PATIENT SURVEYS:  FOTO 42 (goal is 61)   SCREENING FOR RED FLAGS: Bowel or bladder incontinence: No Spinal tumors: No Cauda equina syndrome: No Compression fracture: No Abdominal aneurysm: No   COGNITION:           Overall cognitive status: Within functional limits for tasks assessed  SENSATION: WFL   MUSCLE LENGTH: Hamstrings: Right 50 deg; Left 60 deg Thomas test: Right pos ; Left pos    POSTURE:  left thoracic convex scoliosis   PALPATION: Soft tissue restriction bilateral thoracic and lumbar paraspinals.   LUMBAR ROM:    Active  A/PROM  eval  Flexion Fingertips to distal shin  Extension 75%  Right lateral flexion Fingertips to joint line  Left lateral flexion Fingertips to just above joint line  Right rotation WFL  Left rotation WFL   (Blank rows = not tested)   LOWER EXTREMITY ROM:      WFL   LOWER EXTREMITY MMT:     Generally 4 to 4+/5   LUMBAR SPECIAL  TESTS:  Straight leg raise test: Negative   FUNCTIONAL TESTS:  5 times sit to stand: 10.54 sec GAIT: Distance walked: 50 Assistive device utilized: None Level of assistance: Complete Independence Comments: normal heel to toe progression       TODAY'S TREATMENT  7/7: Supine piriformis stretch and hip IR's stretch 3 x 20 sec each  Standing red band around ankles hip abduction 10x each side Standing red band hip extension 10x each side Manual therapy: soft tissue mobilization to right gluteals and piriformis; neutral gapping grade 4 3x20 sec; right hip long axis distraction, inferior and AP mob in internal rotation grade 3/4 3x 30 sec each Trigger Point Dry-Needling  Treatment instructions: Expect mild to moderate muscle soreness. S/S of pneumothorax if dry needled over a lung field, and to seek immediate medical attention should they occur. Patient verbalized understanding of these instructions and education.  Patient Consent Given: Yes Education handout provided: Yes Muscles treated: right gluteals and piriformis Treatment response/outcome: muscle twitch produced; improved soft tissue mobility       6/30: NuStep x 5 min level 5 Scoliosis stretch over peanut roll to left 10 x 10 sec PPT with dying bug  x 20 Hooklying clam with green band 2 x 10 Side lying clam green band right and left  hip  x 10  Supine piriformis stretch and hip IR's stretch 3 x 20 sec each  Standing red band around ankles hip abduction 10x each side Standing red band hip extension 10x each side  Ice to lumbar spine in hook lying x 10 min      PATIENT EDUCATION:  Education details: DN info; decompression series handout (not on Medbridge) Person educated: Patient Education method: Consulting civil engineer, Media planner, Verbal cues, and Handouts Education comprehension: verbalized understanding, returned demonstration, and verbal cues required     HOME EXERCISE PROGRAM: Access Code: [email or web address] URL:  [email or web address] Date: 10/24/2021 Prepared by: Ruben Im  Exercises - Supine Hamstring Stretch with Strap  - 1 x daily - 7 x weekly - 1 sets - 3 reps - 30 sec hold - Supine ITB Stretch with Strap  - 1 x daily - 7 x weekly - 1 sets - 3 reps - 30 sec hold - Hip Adductors and Hamstring Stretch with Strap  - 1 x daily - 7 x weekly - 1 sets - 3 reps - 30 sec hold - Hooklying Isometric Hip Flexion  - 1 x daily - 7 x weekly - 1 sets - 5 reps - Supine Bent Leg Lift with Knee Extension  - 1 x daily - 7 x weekly - 1 sets - 5 reps - Butterfly Groin Stretch  - 1 x daily - 7 x weekly - 1 sets - 3 reps - 30 sec hold - Seated Piriformis  Stretch with Trunk Bend  - 1 x daily - 7 x weekly - 1 sets - 3 reps - 30 sec hold - Seated Diagonal Chop with Medicine Ball  - 1 x daily - 7 x weekly - 2 sets - 10 reps - Standing Row with Anchored Resistance  - 1 x daily - 7 x weekly - 1 sets - 10 reps - Standing Shoulder Extension with Resistance  - 1 x daily - 7 x weekly - 1 sets - 10 reps - Standing Anti-Rotation Press with Anchored Resistance  - 1 x daily - 7 x weekly - 1 sets - 10 reps - Supine Figure 4 Piriformis Stretch  - 1 x daily - 7 x weekly - 1 sets - 3 reps - 30 sec hold - Supine Piriformis Stretch Pulling Heel to Hip  - 1 x daily - 7 x weekly - 1 sets - 3 reps - 30 sec hold - Supine Dead Bug with Leg Extension  - 1 x daily - 7 x weekly - 1 sets - 10 reps - Clamshell with Resistance  - 1 x daily - 7 x weekly - 1 sets - 10 reps - Standing Hip Abduction with Resistance at Ankles and Counter Support  - 1 x daily - 7 x weekly - 1 sets - 10 reps - Standing Hip Extension with Resistance at Ankles and Counter Support  - 1 x daily - 7 x weekly - 1 sets - 10 reps  Access Code: [email or web address] ASSESSMENT:   CLINICAL IMPRESSION:    The patient demonstrates excellent compliance with HEP but reports an increase in right buttock pain after sitting on the hard bleacher at a baseball game.  She has had DN in  the past and feels this would be helpful today.  She tolerates DN and manual therapy well with improved joint and soft tissue mobility noted.  Therapist monitoring response to all interventions and modifying treatment accordingly.     OBJECTIVE IMPAIRMENTS Abnormal gait, decreased mobility, difficulty walking, decreased ROM, decreased strength, hypomobility, increased fascial restrictions, increased muscle spasms, impaired flexibility, postural dysfunction, and pain.    ACTIVITY LIMITATIONS carrying, lifting, bending, sitting, standing, squatting, sleeping, stairs, transfers, bed mobility, bathing, dressing, hygiene/grooming, and caring for others   PARTICIPATION LIMITATIONS: meal prep, cleaning, laundry, interpersonal relationship, driving, shopping, community activity, yard work, and church   PERSONAL FACTORS Age, Fitness, Time since onset of injury/illness/exacerbation, and 1-2 comorbidities: hx cancer and osteopenia  are also affecting patient's functional outcome.    REHAB POTENTIAL: Good   CLINICAL DECISION MAKING: Evolving/moderate complexity   EVALUATION COMPLEXITY: Moderate     GOALS: Goals reviewed with patient? Yes   SHORT TERM GOALS: Target date: 10/16/2021   Patient will be independent with initial HEP  Baseline: Goal status: Met   2.  Pain report to be no greater than 4/10  Baseline:  Goal status: Met     LONG TERM GOALS: Target date: 11/13/2021   Patient to be independent with advanced HEP  Baseline:  Goal status: INITIAL   2.  Patient to report pain no greater than 2/10  Baseline:  Goal status: INITIAL   3.  Patient to be able to sleep through the night  Baseline:  Goal status: INITIAL   4.  Patient to report 85% improvement in overall symptoms  Baseline:  Goal status: INITIAL   5.  Patient to be able to stand or walk for at least 15 min without leg pain  Baseline:  Goal status: INITIAL       PLAN: PT FREQUENCY: 1-2x/week   PT DURATION: 8  weeks   PLANNED INTERVENTIONS: Therapeutic exercises, Therapeutic activity, Neuromuscular re-education, Balance training, Gait training, Patient/Family education, Joint mobilization, Stair training, Aquatic Therapy, Dry Needling, Electrical stimulation, Spinal mobilization, Cryotherapy, Moist heat, Taping, Traction, Ultrasound, Ionotophoresis 71m/ml Dexamethasone, Manual therapy, and Re-evaluation.   PLAN FOR NEXT SESSION: assess response to DN#1 (may benefit from added DN to lumbar region and/or DN with ES); hip mobilizations; continue with lumbo/pelvic/hip sterengthening;  progress toward planks with modifications (pt used to do and liked the benefit)  SRuben Im PT 10/31/21 9:47 AM Phone: [phone]: [id number] [place] [address]Phone # [phone] [phone]

## 2021-11-04 ENCOUNTER — Ambulatory Visit: Payer: BC Managed Care – PPO

## 2021-11-04 DIAGNOSIS — M5459 Other low back pain: Secondary | ICD-10-CM

## 2021-11-04 DIAGNOSIS — R293 Abnormal posture: Secondary | ICD-10-CM

## 2021-11-04 DIAGNOSIS — M6281 Muscle weakness (generalized): Secondary | ICD-10-CM

## 2021-11-04 DIAGNOSIS — M25551 Pain in right hip: Secondary | ICD-10-CM | POA: Diagnosis not present

## 2021-11-04 DIAGNOSIS — R262 Difficulty in walking, not elsewhere classified: Secondary | ICD-10-CM | POA: Diagnosis not present

## 2021-11-04 DIAGNOSIS — R252 Cramp and spasm: Secondary | ICD-10-CM | POA: Diagnosis not present

## 2021-11-04 NOTE — Therapy (Signed)
OUTPATIENT PHYSICAL THERAPY TREATMENT NOTE   Patient Name: Victoria Hartman MRN: [id number] DOB:13-Aug-1960, 61 y.o., female Today's Date: 11/04/2021   REFERRING PROVIDER: Inez Catalina, MD   END OF SESSION:   PT End of Session - 11/04/21 0811     Visit Number 10    Date for PT Re-Evaluation 11/13/21    Authorization Type BCBS    PT Start Time 0803    PT Stop Time 0845    PT Time Calculation (min) 42 min    Activity Tolerance Patient tolerated treatment well    Behavior During Therapy WFL for tasks assessed/performed             Past Medical History:  Diagnosis Date   Cancer ([place])    breast   Dysplastic nevus syndrome    Medical history non-contributory    Osteopenia    Yeast infection    Past Surgical History:  Procedure Laterality Date   BREAST LUMPECTOMY     GYNECOLOGIC CRYOSURGERY     MOLE REMOVAL     WISDOM TOOTH EXTRACTION     Patient Active Problem List   Diagnosis Date Noted   Sepsis ([place]) 11/13/2013   SIRS (systemic inflammatory response syndrome) ([place]) 11/13/2013   Right knee pain 11/13/2013   Sinusitis 11/13/2013   Osteopenia    Breast cancer ([place]) 01/26/2004    REFERRING DIAG: M16.11 (ICD-10-CM) - Unilateral primary osteoarthritis, right hip M54.50 (ICD-10-CM) - Low back pain, unspecified   THERAPY DIAG: Pain in right hip   Other low back pain   Abnormal posture   Cramp and spasm   Difficulty in walking, not elsewhere classified   Muscle weakness (generalized   Rationale for Evaluation and Treatment Rehabilitation  PERTINENT HISTORY: Patient reports long history of scoliosis and mild back pain but recent worsening and having right LE pain.  She also feels some weakness in her right leg when trying to ascend or descend steps.  She hopes to gain some control over her pain and be able to avoid her scoliosis getting worse.  PRECAUTIONS: osteopenia  SUBJECTIVE:  Patient states she responded very well to dry needling.  She felt great for  the rest of the day and evening but had a gradual return of pain with some activities she did.     PAIN:     Are you having pain? Yes  right buttock pain (no low back pain) NPRS scale: 3/10 (with ibuprofen)  OBJECTIVE: (objective measures completed at initial evaluation unless otherwise dated)  OBJECTIVE:    DIAGNOSTIC FINDINGS:  IMPRESSION: 1. Calcific density noted over the right upper quadrant. This could represent a gallstone or kidney stone.   2.  Stool noted throughout the colon.   3. Severe thoracolumbar spine scoliosis. Multilevel degenerative change thoracolumbar spine. Prominent degenerative changes both hips, most severe on the right. No acute bony abnormality.   Severe degenerative changes right hip with subchondral sclerosis and complete loss of joint space. Prominent subchondral cystic changes noted in the right femoral head and acetabulum are most likely degenerative. Lateral subluxation of the right hip noted, this is most likely degenerative. No evidence of fracture or dislocation. Pelvic calcifications consistent with phleboliths   PATIENT SURVEYS:  FOTO 42 (goal is 61)   SCREENING FOR RED FLAGS: Bowel or bladder incontinence: No Spinal tumors: No Cauda equina syndrome: No Compression fracture: No Abdominal aneurysm: No   COGNITION:           Overall cognitive status: Within functional limits for  tasks assessed                          SENSATION: WFL   MUSCLE LENGTH: Hamstrings: Right 50 deg; Left 60 deg Thomas test: Right pos ; Left pos    POSTURE:  left thoracic convex scoliosis   PALPATION: Soft tissue restriction bilateral thoracic and lumbar paraspinals.   LUMBAR ROM:    Active  A/PROM  eval  Flexion Fingertips to distal shin  Extension 75%  Right lateral flexion Fingertips to joint line  Left lateral flexion Fingertips to just above joint line  Right rotation WFL  Left rotation WFL   (Blank rows = not tested)   LOWER EXTREMITY  ROM:      WFL   LOWER EXTREMITY MMT:     Generally 4 to 4+/5   LUMBAR SPECIAL TESTS:  Straight leg raise test: Negative   FUNCTIONAL TESTS:  5 times sit to stand: 10.54 sec GAIT: Distance walked: 50 Assistive device utilized: None Level of assistance: Complete Independence Comments: normal heel to toe progression       TODAY'S TREATMENT  7/11: NuStep x 5 min level 5 Standing overhead trunk stretch to left by barre x 5 hold 10 sec each Standing trunk stretch left hand to left knee x 5 hold 10 sec each Right sidelying left side flexion 2 x 10 PPT with dying bug  x 20 Hooklying clam with blue band x 20 Side lying clam blue band right and left  hip 2 x 10  Lengthy discussion about posture and sitting surfaces: she had mentioned that she had a lot of pain while sitting at a baseball game on hard chair. Suggested some ideas for reducing her sitting time and taking a walk to reduce intensity of pain.  Also discussed her overall progress and need to make f/u appt with MD.  She is taking ibuprofen 3 times per day.  We discussed that this is a good regimen temporarily but would not be recommended for long periods of time due to its long term effects.  Suggested patient continue this for this entire week, then wean down over the weekend to see how she responds.   Supine hamstring stretch with strap 3 times 20 sec each LE Supine IT band stretch 3 x 20 sec each LE  Ice to lumbar spine in hook lying x 10 min 7/7: Supine piriformis stretch and hip IR's stretch 3 x 20 sec each  Standing red band around ankles hip abduction 10x each side Standing red band hip extension 10x each side Manual therapy: soft tissue mobilization to right gluteals and piriformis; neutral gapping grade 4 3x20 sec; right hip long axis distraction, inferior and AP mob in internal rotation grade 3/4 3x 30 sec each Trigger Point Dry-Needling  Treatment instructions: Expect mild to moderate muscle soreness. S/S of  pneumothorax if dry needled over a lung field, and to seek immediate medical attention should they occur. Patient verbalized understanding of these instructions and education.  Patient Consent Given: Yes Education handout provided: Yes Muscles treated: right gluteals and piriformis Treatment response/outcome: muscle twitch produced; improved soft tissue mobility       6/30: NuStep x 5 min level 5 Scoliosis stretch over peanut roll to left 10 x 10 sec PPT with dying bug  x 20 Hooklying clam with green band 2 x 10 Side lying clam green band right and left  hip  x 10  Supine piriformis stretch  and hip IR's stretch 3 x 20 sec each  Standing red band around ankles hip abduction 10x each side Standing red band hip extension 10x each side  Ice to lumbar spine in hook lying x 10 min      PATIENT EDUCATION:  Education details: DN info; decompression series handout (not on Medbridge) Person educated: Patient Education method: Consulting civil engineer, Media planner, Verbal cues, and Handouts Education comprehension: verbalized understanding, returned demonstration, and verbal cues required     HOME EXERCISE PROGRAM: Access Code: [email or web address] URL: [email or web address] Date: 11/04/2021 Prepared by: Candyce Churn  Exercises - Supine Hamstring Stretch with Strap  - 1 x daily - 7 x weekly - 1 sets - 3 reps - 30 sec hold - Supine ITB Stretch with Strap  - 1 x daily - 7 x weekly - 1 sets - 3 reps - 30 sec hold - Hip Adductors and Hamstring Stretch with Strap  - 1 x daily - 7 x weekly - 1 sets - 3 reps - 30 sec hold - Hooklying Isometric Hip Flexion  - 1 x daily - 7 x weekly - 1 sets - 5 reps - Supine Bent Leg Lift with Knee Extension  - 1 x daily - 7 x weekly - 1 sets - 5 reps - Butterfly Groin Stretch  - 1 x daily - 7 x weekly - 1 sets - 3 reps - 30 sec hold - Seated Piriformis Stretch with Trunk Bend  - 1 x daily - 7 x weekly - 1 sets - 3 reps - 30 sec hold - Seated Diagonal Chop  with Medicine Ball  - 1 x daily - 7 x weekly - 2 sets - 10 reps - Standing Row with Anchored Resistance  - 1 x daily - 7 x weekly - 1 sets - 10 reps - Standing Shoulder Extension with Resistance  - 1 x daily - 7 x weekly - 1 sets - 10 reps - Standing Anti-Rotation Press with Anchored Resistance  - 1 x daily - 7 x weekly - 1 sets - 10 reps - Supine Figure 4 Piriformis Stretch  - 1 x daily - 7 x weekly - 1 sets - 3 reps - 30 sec hold - Supine Piriformis Stretch Pulling Heel to Hip  - 1 x daily - 7 x weekly - 1 sets - 3 reps - 30 sec hold - Supine Posterior Pelvic Tilt  - 1 x daily - 7 x weekly - 3 sets - 10 reps - Supine Dead Bug with Leg Extension  - 1 x daily - 7 x weekly - 1 sets - 10 reps - Hooklying Clamshell with Resistance  - 1 x daily - 7 x weekly - 3 sets - 10 reps - Clamshell with Resistance  - 1 x daily - 7 x weekly - 1 sets - 10 reps - Standing Hip Abduction with Resistance at Ankles and Counter Support  - 1 x daily - 7 x weekly - 1 sets - 10 reps - Standing Hip Extension with Resistance at Ankles and Counter Support  - 1 x daily - 7 x weekly - 1 sets - 10 reps - Standing Sidebending with Chair Support  - 1 x daily - 7 x weekly - 1 sets - 5 reps - 10 hold - Trunk Sidebending with Resistance  - 1 x daily - 7 x weekly - 1 sets - 5 reps - 10 hold ASSESSMENT:   CLINICAL IMPRESSION:    Shalinda responded very well to dry  needling.  She is very compliant and well motivated.  The ibuprofen regimen along with dry needling seems to be controlling her symptoms better. She would benefit from continued skilled PT for core strengthening, stretching and hip stability along with dry needling to reduce spasm and pain.     OBJECTIVE IMPAIRMENTS Abnormal gait, decreased mobility, difficulty walking, decreased ROM, decreased strength, hypomobility, increased fascial restrictions, increased muscle spasms, impaired flexibility, postural dysfunction, and pain.    ACTIVITY LIMITATIONS carrying, lifting,  bending, sitting, standing, squatting, sleeping, stairs, transfers, bed mobility, bathing, dressing, hygiene/grooming, and caring for others   PARTICIPATION LIMITATIONS: meal prep, cleaning, laundry, interpersonal relationship, driving, shopping, community activity, yard work, and church   PERSONAL FACTORS Age, Fitness, Time since onset of injury/illness/exacerbation, and 1-2 comorbidities: hx cancer and osteopenia  are also affecting patient's functional outcome.    REHAB POTENTIAL: Good   CLINICAL DECISION MAKING: Evolving/moderate complexity   EVALUATION COMPLEXITY: Moderate     GOALS: Goals reviewed with patient? Yes   SHORT TERM GOALS: Target date: 10/16/2021   Patient will be independent with initial HEP  Baseline: Goal status: Met   2.  Pain report to be no greater than 4/10  Baseline:  Goal status: Met     LONG TERM GOALS: Target date: 11/13/2021   Patient to be independent with advanced HEP  Baseline:  Goal status: INITIAL   2.  Patient to report pain no greater than 2/10  Baseline:  Goal status: INITIAL   3.  Patient to be able to sleep through the night  Baseline:  Goal status: INITIAL   4.  Patient to report 85% improvement in overall symptoms  Baseline:  Goal status: INITIAL   5.  Patient to be able to stand or walk for at least 15 min without leg pain  Baseline:  Goal status: INITIAL       PLAN: PT FREQUENCY: 1-2x/week   PT DURATION: 8 weeks   PLANNED INTERVENTIONS: Therapeutic exercises, Therapeutic activity, Neuromuscular re-education, Balance training, Gait training, Patient/Family education, Joint mobilization, Stair training, Aquatic Therapy, Dry Needling, Electrical stimulation, Spinal mobilization, Cryotherapy, Moist heat, Taping, Traction, Ultrasound, Ionotophoresis 84m/ml Dexamethasone, Manual therapy, and Re-evaluation.   PLAN FOR NEXT SESSION: assess response to DN#1 (may benefit from added DN to lumbar region and/or DN with ES); hip  mobilizations; continue with lumbo/pelvic/hip sterengthening;  progress toward planks with modifications (pt used to do and liked the benefit)  JAnderson MaltaB. Delores Edelstein, PT 11/04/21 8:47 AM  Phone: [phone]: [phone] [facility] [place] [address]Phone # [phone] [phone]

## 2021-11-07 ENCOUNTER — Ambulatory Visit: Payer: BC Managed Care – PPO | Admitting: Physical Therapy

## 2021-11-07 DIAGNOSIS — M5459 Other low back pain: Secondary | ICD-10-CM | POA: Diagnosis not present

## 2021-11-07 DIAGNOSIS — R252 Cramp and spasm: Secondary | ICD-10-CM | POA: Diagnosis not present

## 2021-11-07 DIAGNOSIS — R262 Difficulty in walking, not elsewhere classified: Secondary | ICD-10-CM | POA: Diagnosis not present

## 2021-11-07 DIAGNOSIS — R293 Abnormal posture: Secondary | ICD-10-CM | POA: Diagnosis not present

## 2021-11-07 DIAGNOSIS — M25551 Pain in right hip: Secondary | ICD-10-CM

## 2021-11-07 DIAGNOSIS — M6281 Muscle weakness (generalized): Secondary | ICD-10-CM | POA: Diagnosis not present

## 2021-11-07 NOTE — Patient Instructions (Signed)

## 2021-11-07 NOTE — Therapy (Signed)
OUTPATIENT PHYSICAL THERAPY TREATMENT NOTE   Patient Name: Victoria Hartman MRN: [id number] DOB:December 25, 1960, 61 y.o., female Today's Date: 11/07/2021   REFERRING PROVIDER: Inez Catalina, MD   END OF SESSION:   PT End of Session - 11/07/21 0755     Visit Number 11    Date for PT Re-Evaluation 11/13/21    Authorization Type BCBS    PT Start Time 0756    PT Stop Time 0836    PT Time Calculation (min) 40 min    Activity Tolerance Patient tolerated treatment well             Past Medical History:  Diagnosis Date   Cancer ([place])    breast   Dysplastic nevus syndrome    Medical history non-contributory    Osteopenia    Yeast infection    Past Surgical History:  Procedure Laterality Date   BREAST LUMPECTOMY     GYNECOLOGIC CRYOSURGERY     MOLE REMOVAL     WISDOM TOOTH EXTRACTION     Patient Active Problem List   Diagnosis Date Noted   Sepsis ([place]) 11/13/2013   SIRS (systemic inflammatory response syndrome) ([place]) 11/13/2013   Right knee pain 11/13/2013   Sinusitis 11/13/2013   Osteopenia    Breast cancer ([place]) 01/26/2004    REFERRING DIAG: M16.11 (ICD-10-CM) - Unilateral primary osteoarthritis, right hip M54.50 (ICD-10-CM) - Low back pain, unspecified   THERAPY DIAG: Pain in right hip   Other low back pain   Abnormal posture   Cramp and spasm   Difficulty in walking, not elsewhere classified   Muscle weakness (generalized   Rationale for Evaluation and Treatment Rehabilitation  PERTINENT HISTORY: Patient reports long history of scoliosis and mild back pain but recent worsening and having right LE pain.  She also feels some weakness in her right leg when trying to ascend or descend steps.  She hopes to gain some control over her pain and be able to avoid her scoliosis getting worse.  PRECAUTIONS: osteopenia  SUBJECTIVE: I felt good the afternoon of DN, got sore that night.   Anderson Malta thought it would be good to do the DN again today.  The ibuprofen really  helps me but I'm going to start weaning off.     PAIN:     Are you having pain? Yes  right buttock pain (no low back pain) NPRS scale: 3 1/2 /10 (with ibuprofen at 7am)  OBJECTIVE: (objective measures completed at initial evaluation unless otherwise dated)  OBJECTIVE:    DIAGNOSTIC FINDINGS:  IMPRESSION: 1. Calcific density noted over the right upper quadrant. This could represent a gallstone or kidney stone.   2.  Stool noted throughout the colon.   3. Severe thoracolumbar spine scoliosis. Multilevel degenerative change thoracolumbar spine. Prominent degenerative changes both hips, most severe on the right. No acute bony abnormality.   Severe degenerative changes right hip with subchondral sclerosis and complete loss of joint space. Prominent subchondral cystic changes noted in the right femoral head and acetabulum are most likely degenerative. Lateral subluxation of the right hip noted, this is most likely degenerative. No evidence of fracture or dislocation. Pelvic calcifications consistent with phleboliths   PATIENT SURVEYS:  FOTO 42 (goal is 61)   SCREENING FOR RED FLAGS: Bowel or bladder incontinence: No Spinal tumors: No Cauda equina syndrome: No Compression fracture: No Abdominal aneurysm: No   COGNITION:           Overall cognitive status: Within functional limits for tasks assessed  SENSATION: WFL   MUSCLE LENGTH: Hamstrings: Right 50 deg; Left 60 deg Thomas test: Right pos ; Left pos    POSTURE:  left thoracic convex scoliosis   PALPATION: Soft tissue restriction bilateral thoracic and lumbar paraspinals.   LUMBAR ROM:    Active  A/PROM  eval  Flexion Fingertips to distal shin  Extension 75%  Right lateral flexion Fingertips to joint line  Left lateral flexion Fingertips to just above joint line  Right rotation WFL  Left rotation WFL   (Blank rows = not tested)   LOWER EXTREMITY ROM:      WFL   LOWER EXTREMITY  MMT:     Generally 4 to 4+/5   LUMBAR SPECIAL TESTS:  Straight leg raise test: Negative   FUNCTIONAL TESTS:  5 times sit to stand: 10.54 sec GAIT: Distance walked: 50 Assistive device utilized: None Level of assistance: Complete Independence Comments: normal heel to toe progression       TODAY'S TREATMENT  7/14: NuStep x 8 min level 5 Discussion HEP: seated piriformis stretch, break up the dead bug ex's  Discussed home TENs options for pain control Manual therapy: soft tissue mobilization to right lumbar paraspinals, gluteals and piriformis Trigger Point Dry-Needling  Treatment instructions: Expect mild to moderate muscle soreness. S/S of pneumothorax if dry needled over a lung field, and to seek immediate medical attention should they occur. Patient verbalized understanding of these instructions and education.  Patient Consent Given: Yes Education handout provided: Yes Muscles treated: right gluteals and right lumbar multifidi With ES 80 frequency to L5 and top of glute 10 min Treatment response/outcome: muscle twitch produced; improved soft tissue mobility  Heat 2 min post DN      7/11: NuStep x 5 min level 5 Standing overhead trunk stretch to left by barre x 5 hold 10 sec each Standing trunk stretch left hand to left knee x 5 hold 10 sec each Right sidelying left side flexion 2 x 10 PPT with dying bug  x 20 Hooklying clam with blue band x 20 Side lying clam blue band right and left  hip 2 x 10  Lengthy discussion about posture and sitting surfaces: she had mentioned that she had a lot of pain while sitting at a baseball game on hard chair. Suggested some ideas for reducing her sitting time and taking a walk to reduce intensity of pain.  Also discussed her overall progress and need to make f/u appt with MD.  She is taking ibuprofen 3 times per day.  We discussed that this is a good regimen temporarily but would not be recommended for long periods of time due to its long  term effects.  Suggested patient continue this for this entire week, then wean down over the weekend to see how she responds.   Supine hamstring stretch with strap 3 times 20 sec each LE Supine IT band stretch 3 x 20 sec each LE  Ice to lumbar spine in hook lying x 10 min    PATIENT EDUCATION:  Education details: DN info; decompression series handout (not on Medbridge); TENS info Person educated: Patient Education method: Explanation, Demonstration, Verbal cues, and Handouts Education comprehension: verbalized understanding, returned demonstration, and verbal cues required     HOME EXERCISE PROGRAM: Access Code: [email or web address] URL: [email or web address] Date: 11/04/2021 Prepared by: Candyce Churn  Exercises - Supine Hamstring Stretch with Strap  - 1 x daily - 7 x weekly - 1 sets - 3 reps - 30 sec hold - Supine  ITB Stretch with Strap  - 1 x daily - 7 x weekly - 1 sets - 3 reps - 30 sec hold - Hip Adductors and Hamstring Stretch with Strap  - 1 x daily - 7 x weekly - 1 sets - 3 reps - 30 sec hold - Hooklying Isometric Hip Flexion  - 1 x daily - 7 x weekly - 1 sets - 5 reps - Supine Bent Leg Lift with Knee Extension  - 1 x daily - 7 x weekly - 1 sets - 5 reps - Butterfly Groin Stretch  - 1 x daily - 7 x weekly - 1 sets - 3 reps - 30 sec hold - Seated Piriformis Stretch with Trunk Bend  - 1 x daily - 7 x weekly - 1 sets - 3 reps - 30 sec hold - Seated Diagonal Chop with Medicine Ball  - 1 x daily - 7 x weekly - 2 sets - 10 reps - Standing Row with Anchored Resistance  - 1 x daily - 7 x weekly - 1 sets - 10 reps - Standing Shoulder Extension with Resistance  - 1 x daily - 7 x weekly - 1 sets - 10 reps - Standing Anti-Rotation Press with Anchored Resistance  - 1 x daily - 7 x weekly - 1 sets - 10 reps - Supine Figure 4 Piriformis Stretch  - 1 x daily - 7 x weekly - 1 sets - 3 reps - 30 sec hold - Supine Piriformis Stretch Pulling Heel to Hip  - 1 x daily - 7 x weekly - 1 sets - 3  reps - 30 sec hold - Supine Posterior Pelvic Tilt  - 1 x daily - 7 x weekly - 3 sets - 10 reps - Supine Dead Bug with Leg Extension  - 1 x daily - 7 x weekly - 1 sets - 10 reps - Hooklying Clamshell with Resistance  - 1 x daily - 7 x weekly - 3 sets - 10 reps - Clamshell with Resistance  - 1 x daily - 7 x weekly - 1 sets - 10 reps - Standing Hip Abduction with Resistance at Ankles and Counter Support  - 1 x daily - 7 x weekly - 1 sets - 10 reps - Standing Hip Extension with Resistance at Ankles and Counter Support  - 1 x daily - 7 x weekly - 1 sets - 10 reps - Standing Sidebending with Chair Support  - 1 x daily - 7 x weekly - 1 sets - 5 reps - 10 hold - Trunk Sidebending with Resistance  - 1 x daily - 7 x weekly - 1 sets - 5 reps - 10 hold ASSESSMENT:   CLINICAL IMPRESSION:    Good carryover with HEP.   Had a good initial response to DN.  Added ES to DN in L5 neuro pattern.  Discussed home TENS unit as an option to decrease medication usage.  Therapist monitoring response to all interventions and modifying treatment accordingly.      OBJECTIVE IMPAIRMENTS Abnormal gait, decreased mobility, difficulty walking, decreased ROM, decreased strength, hypomobility, increased fascial restrictions, increased muscle spasms, impaired flexibility, postural dysfunction, and pain.    ACTIVITY LIMITATIONS carrying, lifting, bending, sitting, standing, squatting, sleeping, stairs, transfers, bed mobility, bathing, dressing, hygiene/grooming, and caring for others   PARTICIPATION LIMITATIONS: meal prep, cleaning, laundry, interpersonal relationship, driving, shopping, community activity, yard work, and church   PERSONAL FACTORS Age, Fitness, Time since onset of injury/illness/exacerbation, and 1-2 comorbidities: hx  cancer and osteopenia  are also affecting patient's functional outcome.    REHAB POTENTIAL: Good   CLINICAL DECISION MAKING: Evolving/moderate complexity   EVALUATION COMPLEXITY: Moderate      GOALS: Goals reviewed with patient? Yes   SHORT TERM GOALS: Target date: 10/16/2021   Patient will be independent with initial HEP  Baseline: Goal status: Met   2.  Pain report to be no greater than 4/10  Baseline:  Goal status: Met     LONG TERM GOALS: Target date: 11/13/2021   Patient to be independent with advanced HEP  Baseline:  Goal status: INITIAL   2.  Patient to report pain no greater than 2/10  Baseline:  Goal status: INITIAL   3.  Patient to be able to sleep through the night  Baseline:  Goal status: INITIAL   4.  Patient to report 85% improvement in overall symptoms  Baseline:  Goal status: INITIAL   5.  Patient to be able to stand or walk for at least 15 min without leg pain  Baseline:  Goal status: INITIAL       PLAN: PT FREQUENCY: 1-2x/week   PT DURATION: 8 weeks   PLANNED INTERVENTIONS: Therapeutic exercises, Therapeutic activity, Neuromuscular re-education, Balance training, Gait training, Patient/Family education, Joint mobilization, Stair training, Aquatic Therapy, Dry Needling, Electrical stimulation, Spinal mobilization, Cryotherapy, Moist heat, Taping, Traction, Ultrasound, Ionotophoresis 13m/ml Dexamethasone, Manual therapy, and Re-evaluation.   PLAN FOR NEXT SESSION: assess response to  DN with ES; hip mobilizations; continue with lumbo/pelvic/hip sterengthening;  progress toward planks with modifications (pt used to do and liked the benefit)  SRuben Im PT 11/07/21 12:45 PM Phone: [phone]: [address] [place] [address]Phone # [phone] [phone]

## 2021-11-11 ENCOUNTER — Encounter: Payer: Self-pay | Admitting: Physical Therapy

## 2021-11-11 ENCOUNTER — Ambulatory Visit: Payer: BC Managed Care – PPO | Admitting: Physical Therapy

## 2021-11-11 ENCOUNTER — Ambulatory Visit: Payer: BC Managed Care – PPO

## 2021-11-11 DIAGNOSIS — M6281 Muscle weakness (generalized): Secondary | ICD-10-CM | POA: Diagnosis not present

## 2021-11-11 DIAGNOSIS — M5459 Other low back pain: Secondary | ICD-10-CM

## 2021-11-11 DIAGNOSIS — R262 Difficulty in walking, not elsewhere classified: Secondary | ICD-10-CM | POA: Diagnosis not present

## 2021-11-11 DIAGNOSIS — M25551 Pain in right hip: Secondary | ICD-10-CM | POA: Diagnosis not present

## 2021-11-11 DIAGNOSIS — R293 Abnormal posture: Secondary | ICD-10-CM

## 2021-11-11 DIAGNOSIS — R252 Cramp and spasm: Secondary | ICD-10-CM | POA: Diagnosis not present

## 2021-11-11 NOTE — Therapy (Signed)
OUTPATIENT PHYSICAL THERAPY TREATMENT NOTE   Patient Name: Victoria Hartman MRN: [id number] DOB:[birth year], 61 y.o., female Today's Date: 11/11/2021   END OF SESSION:   PT End of Session - 11/11/21 1618     Visit Number 12    Date for PT Re-Evaluation 11/13/21    Authorization Type BCBS    PT Start Time 1616    PT Stop Time 1709    PT Time Calculation (min) 53 min    Activity Tolerance Patient tolerated treatment well             Past Medical History:  Diagnosis Date   Cancer ([place])    breast   Dysplastic nevus syndrome    Medical history non-contributory    Osteopenia    Yeast infection    Past Surgical History:  Procedure Laterality Date   BREAST LUMPECTOMY     GYNECOLOGIC CRYOSURGERY     MOLE REMOVAL     WISDOM TOOTH EXTRACTION     Patient Active Problem List   Diagnosis Date Noted   Sepsis ([place]) 11/13/2013   SIRS (systemic inflammatory response syndrome) ([place]) 11/13/2013   Right knee pain 11/13/2013   Sinusitis 11/13/2013   Osteopenia    Breast cancer ([place]) 01/26/2004      REFERRING DIAG: M16.11 (ICD-10-CM) - Unilateral primary osteoarthritis, right hip M54.50 (ICD-10-CM) - Low back pain, unspecified    THERAPY DIAG: Pain in right hip   Other low back pain   Abnormal posture   Cramp and spasm   Difficulty in walking, not elsewhere classified   Muscle weakness (generalized     Rationale for Evaluation and Treatment Rehabilitation   PERTINENT HISTORY: Patient reports long history of scoliosis and mild back pain but recent worsening and having right LE pain.  She also feels some weakness in her right leg when trying to ascend or descend steps.  She hopes to gain some control over her pain and be able to avoid her scoliosis getting worse.   PRECAUTIONS: osteopenia   SUBJECTIVE: Pt states that she is still needing to take the ibuprofen to ease her pain. She took Ibuprofen about an hour before coming today, so her pain is around a 3 or so. She has  one more appointment then she is following up with her MD next week.      PAIN:      Are you having pain? Yes  Rt SI pain NPRS scale: 3/10 (with ibuprofen at 3PM)   OBJECTIVE: (objective measures completed at initial evaluation unless otherwise dated)   OBJECTIVE:    DIAGNOSTIC FINDINGS:  IMPRESSION: 1. Calcific density noted over the right upper quadrant. This could represent a gallstone or kidney stone.   2.  Stool noted throughout the colon.   3. Severe thoracolumbar spine scoliosis. Multilevel degenerative change thoracolumbar spine. Prominent degenerative changes both hips, most severe on the right. No acute bony abnormality.   Severe degenerative changes right hip with subchondral sclerosis and complete loss of joint space. Prominent subchondral cystic changes noted in the right femoral head and acetabulum are most likely degenerative. Lateral subluxation of the right hip noted, this is most likely degenerative. No evidence of fracture or dislocation. Pelvic calcifications consistent with phleboliths   PATIENT SURVEYS:  FOTO 42 (goal is 61) (11/11/21): FOTO 54  SCREENING FOR RED FLAGS: Bowel or bladder incontinence: No Spinal tumors: No Cauda equina syndrome: No Compression fracture: No Abdominal aneurysm: No   COGNITION:  Overall cognitive status: Within functional limits for tasks assessed                          SENSATION: WFL   MUSCLE LENGTH: Hamstrings: Right 50 deg; Left 60 deg Thomas test: Right pos ; Left pos    POSTURE:  left thoracic convex scoliosis   PALPATION: Soft tissue restriction bilateral thoracic and lumbar paraspinals.   LUMBAR ROM:    Active  A/PROM  eval  Flexion Fingertips to distal shin  Extension 75%  Right lateral flexion Fingertips to joint line  Left lateral flexion Fingertips to just above joint line  Right rotation WFL  Left rotation WFL   (Blank rows = not tested)   LOWER EXTREMITY ROM:      WFL    LOWER EXTREMITY MMT:     Generally 4 to 4+/5   LUMBAR SPECIAL TESTS:  Straight leg raise test: Negative   FUNCTIONAL TESTS:  5 times sit to stand: 10.54 sec GAIT: Distance walked: 50 Assistive device utilized: None Level of assistance: Complete Independence Comments: normal heel to toe progression       TODAY'S TREATMENT  11/11/21: Extensive review of HEP and reps/setup PT answered pt questions regarding specific exercises in her HEP - Seated Diagonal Chop with Medicine Ball  x10 reps  - Supine Figure 4 Piriformis Stretch 2 reps - 30 sec hold - Supine Piriformis Stretch with Foot on Ground  x20 sec hold   - Supine Bent Knee Foot Taps  10 reps education on exercise progressions - Supine Hip Internal and External Rotation  10 reps slow and instructed not to push too far into stretch  - Side Lunge Adductor Stretch  x20 sec hold x2 reps - Squat with Chair Touch  holding #5,10 reps  7/14: NuStep x 8 min level 5 Discussion HEP: seated piriformis stretch, break up the dead bug ex's  Discussed home TENs options for pain control Manual therapy: soft tissue mobilization to right lumbar paraspinals, gluteals and piriformis Trigger Point Dry-Needling  Treatment instructions: Expect mild to moderate muscle soreness. S/S of pneumothorax if dry needled over a lung field, and to seek immediate medical attention should they occur. Patient verbalized understanding of these instructions and education.   Patient Consent Given: Yes Education handout provided: Yes Muscles treated: right gluteals and right lumbar multifidi With ES 80 frequency to L5 and top of glute 10 min Treatment response/outcome: muscle twitch produced; improved soft tissue mobility  Heat 2 min post DN           7/11: NuStep x 5 min level 5 Standing overhead trunk stretch to left by barre x 5 hold 10 sec each Standing trunk stretch left hand to left knee x 5 hold 10 sec each Right sidelying left side flexion 2 x  10 PPT with dying bug  x 20 Hooklying clam with blue band x 20 Side lying clam blue band right and left  hip 2 x 10  Lengthy discussion about posture and sitting surfaces: she had mentioned that she had a lot of pain while sitting at a baseball game on hard chair. Suggested some ideas for reducing her sitting time and taking a walk to reduce intensity of pain.  Also discussed her overall progress and need to make f/u appt with MD.  She is taking ibuprofen 3 times per day.  We discussed that this is a good regimen temporarily but would not be recommended for long  periods of time due to its long term effects.  Suggested patient continue this for this entire week, then wean down over the weekend to see how she responds.   Supine hamstring stretch with strap 3 times 20 sec each LE Supine IT band stretch 3 x 20 sec each LE  Ice to lumbar spine in hook lying x 10 min       PATIENT EDUCATION:  Education details: DN info; decompression series handout (not on Medbridge); TENS info Person educated: Patient Education method: Explanation, Demonstration, Verbal cues, and Handouts Education comprehension: verbalized understanding, returned demonstration, and verbal cues required     HOME EXERCISE PROGRAM: Access Code: [email or web address] URL: [email or web address] Date: 11/11/2021 Prepared by: [facility]  Exercises - Seated Diagonal Chop with Medicine Ball  - 1 x daily - 7 x weekly - 2 sets - 10 reps - Standing Shoulder Extension with Resistance  - 1 x daily - 7 x weekly - 1 sets - 10 reps - Standing Anti-Rotation Press with Anchored Resistance  - 1 x daily - 7 x weekly - 1 sets - 10 reps - Supine Figure 4 Piriformis Stretch  - 1 x daily - 7 x weekly - 1 sets - 3 reps - 30 sec hold - Supine Piriformis Stretch with Foot on Ground  - 1 x daily - 7 x weekly - 3 sets - 10 reps - Clamshell with Resistance  - 1 x daily - 7 x weekly - 1 sets - 10 reps -  Standing Hip Abduction with Resistance at Ankles and Counter Support  - 1 x daily - 7 x weekly - 1 sets - 10 reps - Standing Hip Extension with Resistance at Ankles and Counter Support  - 1 x daily - 7 x weekly - 1 sets - 10 reps - Standing Sidebending with Chair Support  - 1 x daily - 7 x weekly - 1 sets - 5 reps - 10 hold - Trunk Sidebending with Resistance  - 1 x daily - 7 x weekly - 1 sets - 5 reps - 10 hold - Supine Bent Knee Foot Taps  - 1 x daily - 7 x weekly - 3 sets - 10 reps - Supine Hip Internal and External Rotation  - 1 x daily - 7 x weekly - 3 sets - 10 reps - Side Lunge Adductor Stretch  - 1 x daily - 7 x weekly - 3 sets - 10 reps - Squat with Chair Touch  - 1 x daily - 7 x weekly - 3 sets - 10 reps ASSESSMENT:   CLINICAL IMPRESSION:    Pt's FOTO score has improved to 54 from 42 at her evaluation. Pt continues to rely on ibuprofen for pain management. PT updated pt's HEP per her request and made adjustments and recommendations regarding exercise progressions and pain improvement. PT also addressed pt questions/concerns regarding her HEP. Pt denied pain increase end of session.    OBJECTIVE IMPAIRMENTS Abnormal gait, decreased mobility, difficulty walking, decreased ROM, decreased strength, hypomobility, increased fascial restrictions, increased muscle spasms, impaired flexibility, postural dysfunction, and pain.    ACTIVITY LIMITATIONS carrying, lifting, bending, sitting, standing, squatting, sleeping, stairs, transfers, bed mobility, bathing, dressing, hygiene/grooming, and caring for others   PARTICIPATION LIMITATIONS: meal prep, cleaning, laundry, interpersonal relationship, driving, shopping, community activity, yard work, and church   PERSONAL FACTORS Age, Fitness, Time since onset of injury/illness/exacerbation, and 1-2 comorbidities: hx cancer and osteopenia  are also affecting  patient's functional outcome.    REHAB POTENTIAL: Good   CLINICAL DECISION MAKING:  Evolving/moderate complexity   EVALUATION COMPLEXITY: Moderate     GOALS: Goals reviewed with patient? Yes   SHORT TERM GOALS: Target date: 10/16/2021   Patient will be independent with initial HEP  Baseline: Goal status: Met   2.  Pain report to be no greater than 4/10  Baseline:  Goal status: Met     LONG TERM GOALS: Target date: 11/13/2021   Patient to be independent with advanced HEP  Baseline:  Goal status: INITIAL   2.  Patient to report pain no greater than 2/10  Baseline:  Goal status: INITIAL   3.  Patient to be able to sleep through the night  Baseline:  Goal status: INITIAL   4.  Patient to report 85% improvement in overall symptoms  Baseline:  Goal status: INITIAL   5.  Patient to be able to stand or walk for at least 15 min without leg pain  Baseline:  Goal status: INITIAL       PLAN: PT FREQUENCY: 1-2x/week   PT DURATION: 8 weeks   PLANNED INTERVENTIONS: Therapeutic exercises, Therapeutic activity, Neuromuscular re-education, Balance training, Gait training, Patient/Family education, Joint mobilization, Stair training, Aquatic Therapy, Dry Needling, Electrical stimulation, Spinal mobilization, Cryotherapy, Moist heat, Taping, Traction, Ultrasound, Ionotophoresis 29m/ml Dexamethasone, Manual therapy, and Re-evaluation.   PLAN FOR NEXT SESSION: re-eval; pt returns to referring MD next week   8:38 PM,11/11/21 SSherol DadePT, DRussiaat [place]

## 2021-11-14 ENCOUNTER — Ambulatory Visit: Payer: BC Managed Care – PPO

## 2021-11-14 DIAGNOSIS — R262 Difficulty in walking, not elsewhere classified: Secondary | ICD-10-CM | POA: Diagnosis not present

## 2021-11-14 DIAGNOSIS — M25551 Pain in right hip: Secondary | ICD-10-CM | POA: Diagnosis not present

## 2021-11-14 DIAGNOSIS — R252 Cramp and spasm: Secondary | ICD-10-CM | POA: Diagnosis not present

## 2021-11-14 DIAGNOSIS — M5459 Other low back pain: Secondary | ICD-10-CM

## 2021-11-14 DIAGNOSIS — M6281 Muscle weakness (generalized): Secondary | ICD-10-CM

## 2021-11-14 DIAGNOSIS — R293 Abnormal posture: Secondary | ICD-10-CM

## 2021-11-14 NOTE — Therapy (Addendum)
OUTPATIENT PHYSICAL THERAPY RECERTIFICATION NOTE   Patient Name: Victoria Hartman MRN: [id number] DOB:[birth year], 61 y.o., female Today's Date: 11/14/2021   END OF SESSION:   PT End of Session - 11/14/21 0809     Visit Number 13    Date for PT Re-Evaluation 12/11/21    Authorization Type [employer]    PT Start Time 0804    PT Stop Time 0845    PT Time Calculation (min) 41 min    Activity Tolerance Patient tolerated treatment well    Behavior During Therapy WFL for tasks assessed/performed             Past Medical History:  Diagnosis Date   Cancer ([place])    breast   Dysplastic nevus syndrome    Medical history non-contributory    Osteopenia    Yeast infection    Past Surgical History:  Procedure Laterality Date   BREAST LUMPECTOMY     GYNECOLOGIC CRYOSURGERY     MOLE REMOVAL     WISDOM TOOTH EXTRACTION     Patient Active Problem List   Diagnosis Date Noted   Sepsis ([place]) 11/13/2013   SIRS (systemic inflammatory response syndrome) ([place]) 11/13/2013   Right knee pain 11/13/2013   Sinusitis 11/13/2013   Osteopenia    Breast cancer ([place]) 01/26/2004      REFERRING DIAG: M16.11 (ICD-10-CM) - Unilateral primary osteoarthritis, right hip M54.50 (ICD-10-CM) - Low back pain, unspecified    THERAPY DIAG: Pain in right hip   Other low back pain   Abnormal posture   Cramp and spasm   Difficulty in walking, not elsewhere classified   Muscle weakness (generalized     Rationale for Evaluation and Treatment Rehabilitation   PERTINENT HISTORY: Patient reports long history of scoliosis and mild back pain but recent worsening and having right LE pain.  She also feels some weakness in her right leg when trying to ascend or descend steps.  She hopes to gain some control over her pain and be able to avoid her scoliosis getting worse.   PRECAUTIONS: osteopenia   SUBJECTIVE: Pt states that she is having a little more pain today due to trying to go without ibuprofen.  Sees MD on  Monday.    PAIN:      Are you having pain? Yes  Rt SI pain NPRS scale: 6/10 (without Ibuprofen)   OBJECTIVE: (objective measures completed at initial evaluation unless otherwise dated)   OBJECTIVE:    DIAGNOSTIC FINDINGS:  IMPRESSION: 1. Calcific density noted over the right upper quadrant. This could represent a gallstone or kidney stone.   2.  Stool noted throughout the colon.   3. Severe thoracolumbar spine scoliosis. Multilevel degenerative change thoracolumbar spine. Prominent degenerative changes both hips, most severe on the right. No acute bony abnormality.   Severe degenerative changes right hip with subchondral sclerosis and complete loss of joint space. Prominent subchondral cystic changes noted in the right femoral head and acetabulum are most likely degenerative. Lateral subluxation of the right hip noted, this is most likely degenerative. No evidence of fracture or dislocation. Pelvic calcifications consistent with phleboliths   PATIENT SURVEYS:  FOTO 42 (goal is 61) (11/11/21): FOTO 54  SCREENING FOR RED FLAGS: Bowel or bladder incontinence: No Spinal tumors: No Cauda equina syndrome: No Compression fracture: No Abdominal aneurysm: No   COGNITION:           Overall cognitive status: Within functional limits for tasks assessed  SENSATION: WFL   MUSCLE LENGTH: Hamstrings: Right 50 deg; Left 60 deg  Thomas test: Right pos ; Left pos  11/14/21: Hamstrings: Right 55 deg; Left 65 deg  Thomas test: Right pos ; Left pos   POSTURE:  left thoracic convex scoliosis   PALPATION: Soft tissue restriction bilateral thoracic and lumbar paraspinals.  11/14/21: unchanged, most likely due to scoliosis and guarding LUMBAR ROM:    Active  A/PROM  eval AROM 11/14/21  Flexion Fingertips to distal shin WFL  Extension 75% WFL  Right lateral flexion Fingertips to joint line WFL  Left lateral flexion Fingertips to just above joint line WFL   Right rotation [facility] WFL  Left rotation [facility] WFL   (Blank rows = not tested)   LOWER EXTREMITY ROM:      WFL   LOWER EXTREMITY MMT:     Generally 4 to 4+/5   LUMBAR SPECIAL TESTS:  Straight leg raise test: Negative   FUNCTIONAL TESTS:  5 times sit to stand: 10.54 sec GAIT: Distance walked: 50 Assistive device utilized: None Level of assistance: Complete Independence Comments: normal heel to toe progression       TODAY'S TREATMENT  8/58/[birth year]: Recert visit completed Reviewed updated HEP Lengthy discussion re: status and progress: sees provider on Monday and will discuss other options but for now, we will plan on continuing PT for strength and flexibility Added prone lying, prone on elbows and prone press up (prone lying and on elbows x 2 min, press up x 10) Prone on elbows with left trunk shift x 2 min Chops with yellow plyo ball x 20 each Prone on elbows with thoracic rotation x 10 hold 2-3 sec 11/11/21: Extensive review of HEP and reps/setup PT answered pt questions regarding specific exercises in her HEP - Seated Diagonal Chop with Medicine Ball  x10 reps  - Supine Figure 4 Piriformis Stretch 2 reps - 30 sec hold - Supine Piriformis Stretch with Foot on Ground  x20 sec hold   - Supine Bent Knee Foot Taps  10 reps education on exercise progressions - Supine Hip Internal and External Rotation  10 reps slow and instructed not to push too far into stretch  - Side Lunge Adductor Stretch  x20 sec hold x2 reps - Squat with Chair Touch  holding #5,10 reps  7/14: NuStep x 8 min level 5 Discussion HEP: seated piriformis stretch, break up the dead bug ex's  Discussed home TENs options for pain control Manual therapy: soft tissue mobilization to right lumbar paraspinals, gluteals and piriformis Trigger Point Dry-Needling  Treatment instructions: Expect mild to moderate muscle soreness. S/S of pneumothorax if dry needled over a lung field, and to seek immediate medical attention  should they occur. Patient verbalized understanding of these instructions and education.   Patient Consent Given: Yes Education handout provided: Yes Muscles treated: right gluteals and right lumbar multifidi With ES 80 frequency to L5 and top of glute 10 min Treatment response/outcome: muscle twitch produced; improved soft tissue mobility  Heat 2 min post DN       PATIENT EDUCATION:  Education details: DN info; decompression series handout (not on Medbridge); TENS info Person educated: Patient Education method: Explanation, Demonstration, Verbal cues, and Handouts Education comprehension: verbalized understanding, returned demonstration, and verbal cues required     HOME EXERCISE PROGRAM: Access Code: [email or web address] URL: [email or web address] Date: 11/11/2021 Prepared by: [facility]  Exercises - Seated Diagonal Chop with Medicine Diona Foley  - 1  x daily - 7 x weekly - 2 sets - 10 reps - Standing Shoulder Extension with Resistance  - 1 x daily - 7 x weekly - 1 sets - 10 reps - Standing Anti-Rotation Press with Anchored Resistance  - 1 x daily - 7 x weekly - 1 sets - 10 reps - Supine Figure 4 Piriformis Stretch  - 1 x daily - 7 x weekly - 1 sets - 3 reps - 30 sec hold - Supine Piriformis Stretch with Foot on Ground  - 1 x daily - 7 x weekly - 3 sets - 10 reps - Clamshell with Resistance  - 1 x daily - 7 x weekly - 1 sets - 10 reps - Standing Hip Abduction with Resistance at Ankles and Counter Support  - 1 x daily - 7 x weekly - 1 sets - 10 reps - Standing Hip Extension with Resistance at Ankles and Counter Support  - 1 x daily - 7 x weekly - 1 sets - 10 reps - Standing Sidebending with Chair Support  - 1 x daily - 7 x weekly - 1 sets - 5 reps - 10 hold - Trunk Sidebending with Resistance  - 1 x daily - 7 x weekly - 1 sets - 5 reps - 10 hold - Supine Bent Knee Foot Taps  - 1 x daily - 7 x weekly - 3 sets - 10 reps - Supine Hip  Internal and External Rotation  - 1 x daily - 7 x weekly - 3 sets - 10 reps - Side Lunge Adductor Stretch  - 1 x daily - 7 x weekly - 3 sets - 10 reps - Squat with Chair Touch  - 1 x daily - 7 x weekly - 3 sets - 10 reps ASSESSMENT:   CLINICAL IMPRESSION:    Pt's FOTO score has improved to 54 from 42 at her evaluation. Pt continues to rely on ibuprofen for pain management. Generally about the same from a postural standpoint but she is more knowledgeable about her condition and how to manage.  She did well with addition of McKenzie extension and thoracic rotation in prone on elbows position.  We will proceed based on MD re-assessment.  Patient is well motivated and very compliant with all HEP.     OBJECTIVE IMPAIRMENTS Abnormal gait, decreased mobility, difficulty walking, decreased ROM, decreased strength, hypomobility, increased fascial restrictions, increased muscle spasms, impaired flexibility, postural dysfunction, and pain.    ACTIVITY LIMITATIONS carrying, lifting, bending, sitting, standing, squatting, sleeping, stairs, transfers, bed mobility, bathing, dressing, hygiene/grooming, and caring for others   PARTICIPATION LIMITATIONS: meal prep, cleaning, laundry, interpersonal relationship, driving, shopping, community activity, yard work, and church   PERSONAL FACTORS Age, Fitness, Time since onset of injury/illness/exacerbation, and 1-2 comorbidities: hx cancer and osteopenia  are also affecting patient's functional outcome.    REHAB POTENTIAL: Good   CLINICAL DECISION MAKING: Evolving/moderate complexity   EVALUATION COMPLEXITY: Moderate     GOALS: Goals reviewed with patient? Yes   SHORT TERM GOALS: Target date: 10/16/2021   Patient will be independent with initial HEP  Baseline: Goal status: Met   2.  Pain report to be no greater than 4/10  Baseline:  Goal status: Met     LONG TERM GOALS: Target date: 12/11/2021   Patient to be independent with advanced HEP  Baseline:   Goal status: MET   2.  Patient to report pain no greater than 2/10  Baseline:  Goal status: IN PROGRESS  3.  Patient to be able to sleep through the night  Baseline:  Goal status: IN PROGRESS   4.  Patient to report 85% improvement in overall symptoms  Baseline:  Goal status: IN PROGRESS   5.  Patient to be able to stand or walk for at least 15 min without leg pain  Baseline:  Goal status: IN PROGRESS       PLAN: PT FREQUENCY: 1-2x/week   PT DURATION: 4 more weeks   PLANNED INTERVENTIONS: Therapeutic exercises, Therapeutic activity, Neuromuscular re-education, Balance training, Gait training, Patient/Family education, Joint mobilization, Stair training, Aquatic Therapy, Dry Needling, Electrical stimulation, Spinal mobilization, Cryotherapy, Moist heat, Taping, Traction, Ultrasound, Ionotophoresis 79m/ml Dexamethasone, Manual therapy, and Re-evaluation.   PLAN FOR NEXT SESSION: Pt returns to referring MD on Monday.  We will proceed based on options suggested for patient.  Possibly discuss option of aquatic therapy.    PHYSICAL THERAPY DISCHARGE SUMMARY  Visits from Start of Care: 13  Current functional level related to goals / functional outcomes: See above   Remaining deficits: See above   Education / Equipment: See above   Patient agrees to discharge. Patient goals were partially met. Patient is being discharged due to lack of progress.   JAnderson MaltaB. Dayne Chait, PT 01/01/22 12:28 PM  [place]

## 2021-11-17 DIAGNOSIS — M4125 Other idiopathic scoliosis, thoracolumbar region: Secondary | ICD-10-CM | POA: Diagnosis not present

## 2021-11-17 DIAGNOSIS — M1611 Unilateral primary osteoarthritis, right hip: Secondary | ICD-10-CM | POA: Diagnosis not present

## 2021-12-04 DIAGNOSIS — M5451 Vertebrogenic low back pain: Secondary | ICD-10-CM | POA: Diagnosis not present

## 2021-12-04 DIAGNOSIS — M1611 Unilateral primary osteoarthritis, right hip: Secondary | ICD-10-CM | POA: Diagnosis not present

## 2022-01-09 DIAGNOSIS — Z1231 Encounter for screening mammogram for malignant neoplasm of breast: Secondary | ICD-10-CM | POA: Diagnosis not present

## 2022-01-29 DIAGNOSIS — Z23 Encounter for immunization: Secondary | ICD-10-CM | POA: Diagnosis not present

## 2022-02-05 DIAGNOSIS — M1611 Unilateral primary osteoarthritis, right hip: Secondary | ICD-10-CM | POA: Diagnosis not present

## 2022-03-06 DIAGNOSIS — I10 Essential (primary) hypertension: Secondary | ICD-10-CM | POA: Diagnosis not present

## 2022-03-06 DIAGNOSIS — Z01818 Encounter for other preprocedural examination: Secondary | ICD-10-CM | POA: Diagnosis not present

## 2022-03-06 DIAGNOSIS — M25851 Other specified joint disorders, right hip: Secondary | ICD-10-CM | POA: Diagnosis not present

## 2022-03-10 DIAGNOSIS — U071 COVID-19: Secondary | ICD-10-CM | POA: Diagnosis not present

## 2022-03-10 DIAGNOSIS — R0981 Nasal congestion: Secondary | ICD-10-CM | POA: Diagnosis not present

## 2022-03-10 DIAGNOSIS — I10 Essential (primary) hypertension: Secondary | ICD-10-CM | POA: Diagnosis not present

## 2022-05-06 DIAGNOSIS — Z0189 Encounter for other specified special examinations: Secondary | ICD-10-CM | POA: Diagnosis not present

## 2022-05-25 ENCOUNTER — Ambulatory Visit: Payer: BC Managed Care – PPO | Admitting: Dermatology

## 2022-05-26 DIAGNOSIS — M1611 Unilateral primary osteoarthritis, right hip: Secondary | ICD-10-CM | POA: Diagnosis not present

## 2022-05-26 DIAGNOSIS — Z96641 Presence of right artificial hip joint: Secondary | ICD-10-CM | POA: Diagnosis not present

## 2022-07-23 DIAGNOSIS — K529 Noninfective gastroenteritis and colitis, unspecified: Secondary | ICD-10-CM | POA: Diagnosis not present

## 2022-08-06 DIAGNOSIS — K529 Noninfective gastroenteritis and colitis, unspecified: Secondary | ICD-10-CM | POA: Diagnosis not present

## 2022-08-11 DIAGNOSIS — H2513 Age-related nuclear cataract, bilateral: Secondary | ICD-10-CM | POA: Diagnosis not present

## 2022-08-11 DIAGNOSIS — H43391 Other vitreous opacities, right eye: Secondary | ICD-10-CM | POA: Diagnosis not present

## 2022-09-25 DIAGNOSIS — Z0000 Encounter for general adult medical examination without abnormal findings: Secondary | ICD-10-CM | POA: Diagnosis not present

## 2022-11-04 DIAGNOSIS — H2513 Age-related nuclear cataract, bilateral: Secondary | ICD-10-CM | POA: Diagnosis not present

## 2022-11-04 DIAGNOSIS — H1013 Acute atopic conjunctivitis, bilateral: Secondary | ICD-10-CM | POA: Diagnosis not present

## 2022-11-04 DIAGNOSIS — H43391 Other vitreous opacities, right eye: Secondary | ICD-10-CM | POA: Diagnosis not present

## 2023-01-22 DIAGNOSIS — Z1231 Encounter for screening mammogram for malignant neoplasm of breast: Secondary | ICD-10-CM | POA: Diagnosis not present

## 2023-01-27 DIAGNOSIS — Z23 Encounter for immunization: Secondary | ICD-10-CM | POA: Diagnosis not present

## 2023-04-01 DIAGNOSIS — R7401 Elevation of levels of liver transaminase levels: Secondary | ICD-10-CM | POA: Diagnosis not present

## 2023-04-01 DIAGNOSIS — R799 Abnormal finding of blood chemistry, unspecified: Secondary | ICD-10-CM | POA: Diagnosis not present

## 2023-04-01 DIAGNOSIS — J029 Acute pharyngitis, unspecified: Secondary | ICD-10-CM | POA: Diagnosis not present

## 2023-06-10 DIAGNOSIS — Z96641 Presence of right artificial hip joint: Secondary | ICD-10-CM | POA: Diagnosis not present

## 2023-08-18 DIAGNOSIS — H1013 Acute atopic conjunctivitis, bilateral: Secondary | ICD-10-CM | POA: Diagnosis not present

## 2023-08-18 DIAGNOSIS — H2513 Age-related nuclear cataract, bilateral: Secondary | ICD-10-CM | POA: Diagnosis not present

## 2023-08-18 DIAGNOSIS — H43391 Other vitreous opacities, right eye: Secondary | ICD-10-CM | POA: Diagnosis not present

## 2023-09-28 DIAGNOSIS — D2262 Melanocytic nevi of left upper limb, including shoulder: Secondary | ICD-10-CM | POA: Diagnosis not present

## 2023-09-28 DIAGNOSIS — D225 Melanocytic nevi of trunk: Secondary | ICD-10-CM | POA: Diagnosis not present

## 2023-09-28 DIAGNOSIS — L821 Other seborrheic keratosis: Secondary | ICD-10-CM | POA: Diagnosis not present

## 2023-09-28 DIAGNOSIS — L570 Actinic keratosis: Secondary | ICD-10-CM | POA: Diagnosis not present

## 2023-10-05 DIAGNOSIS — M8580 Other specified disorders of bone density and structure, unspecified site: Secondary | ICD-10-CM | POA: Diagnosis not present

## 2023-10-05 DIAGNOSIS — Z0000 Encounter for general adult medical examination without abnormal findings: Secondary | ICD-10-CM | POA: Diagnosis not present

## 2023-10-20 DIAGNOSIS — M8588 Other specified disorders of bone density and structure, other site: Secondary | ICD-10-CM | POA: Diagnosis not present

## 2023-10-20 DIAGNOSIS — E2839 Other primary ovarian failure: Secondary | ICD-10-CM | POA: Diagnosis not present

## 2023-11-04 DIAGNOSIS — M810 Age-related osteoporosis without current pathological fracture: Secondary | ICD-10-CM | POA: Diagnosis not present

## 2023-11-04 DIAGNOSIS — L989 Disorder of the skin and subcutaneous tissue, unspecified: Secondary | ICD-10-CM | POA: Diagnosis not present

## 2024-01-28 DIAGNOSIS — Z1231 Encounter for screening mammogram for malignant neoplasm of breast: Secondary | ICD-10-CM | POA: Diagnosis not present

## 2024-02-04 DIAGNOSIS — M546 Pain in thoracic spine: Secondary | ICD-10-CM | POA: Diagnosis not present

## 2024-02-04 DIAGNOSIS — M810 Age-related osteoporosis without current pathological fracture: Secondary | ICD-10-CM | POA: Diagnosis not present

## 2024-02-04 DIAGNOSIS — M8580 Other specified disorders of bone density and structure, unspecified site: Secondary | ICD-10-CM | POA: Diagnosis not present
